# Patient Record
Sex: Female | Born: 1980 | Race: Asian | Hispanic: No | Marital: Married | Smoking: Never smoker
Health system: Southern US, Community
[De-identification: ages and names within clinical notes are randomized; demographics above are authoritative.]

## PROBLEM LIST (undated history)

## (undated) DIAGNOSIS — O24419 Gestational diabetes mellitus in pregnancy, unspecified control: Secondary | ICD-10-CM

---

## 2012-12-27 LAB — OB RESULTS CONSOLE RUBELLA ANTIBODY, IGM: Rubella: IMMUNE

## 2013-12-27 LAB — OB RESULTS CONSOLE ABO/RH: RH TYPE: POSITIVE

## 2013-12-27 LAB — OB RESULTS CONSOLE GC/CHLAMYDIA
CHLAMYDIA, DNA PROBE: NEGATIVE
Gonorrhea: NEGATIVE

## 2013-12-27 LAB — OB RESULTS CONSOLE VARICELLA ZOSTER ANTIBODY, IGG: Varicella: IMMUNE

## 2013-12-27 LAB — OB RESULTS CONSOLE RPR: RPR: NONREACTIVE

## 2013-12-27 LAB — OB RESULTS CONSOLE HGB/HCT, BLOOD
HCT: 39 %
HEMOGLOBIN: 12.6 g/dL

## 2013-12-27 LAB — OB RESULTS CONSOLE ANTIBODY SCREEN: Antibody Screen: NEGATIVE

## 2013-12-27 LAB — OB RESULTS CONSOLE HEPATITIS B SURFACE ANTIGEN: HEP B S AG: NEGATIVE

## 2013-12-27 LAB — GLUCOSE TOLERANCE, 1 HOUR (50G) W/O FASTING: Glucose, 1 hour: 154

## 2013-12-30 LAB — GLUCOSE TOLERANCE, 3 HOURS: Glucose: 90

## 2014-01-04 DIAGNOSIS — O24419 Gestational diabetes mellitus in pregnancy, unspecified control: Secondary | ICD-10-CM

## 2014-01-04 DIAGNOSIS — Z789 Other specified health status: Secondary | ICD-10-CM | POA: Insufficient documentation

## 2014-01-05 ENCOUNTER — Encounter: Payer: Self-pay | Admitting: *Deleted

## 2014-01-10 ENCOUNTER — Ambulatory Visit (INDEPENDENT_AMBULATORY_CARE_PROVIDER_SITE_OTHER): Payer: Self-pay | Admitting: Obstetrics & Gynecology

## 2014-01-10 ENCOUNTER — Encounter: Payer: Self-pay | Admitting: *Deleted

## 2014-01-10 ENCOUNTER — Encounter: Payer: Self-pay | Admitting: Obstetrics & Gynecology

## 2014-01-10 ENCOUNTER — Other Ambulatory Visit: Payer: Self-pay | Admitting: Obstetrics & Gynecology

## 2014-01-10 VITALS — BP 107/55 | HR 86 | Temp 98.3°F | Ht <= 58 in | Wt 87.5 lb

## 2014-01-10 DIAGNOSIS — Z3682 Encounter for antenatal screening for nuchal translucency: Secondary | ICD-10-CM

## 2014-01-10 DIAGNOSIS — O24410 Gestational diabetes mellitus in pregnancy, diet controlled: Secondary | ICD-10-CM

## 2014-01-10 LAB — POCT URINALYSIS DIP (DEVICE)
Bilirubin Urine: NEGATIVE
Glucose, UA: NEGATIVE mg/dL
HGB URINE DIPSTICK: NEGATIVE
Ketones, ur: NEGATIVE mg/dL
Nitrite: NEGATIVE
Protein, ur: NEGATIVE mg/dL
SPECIFIC GRAVITY, URINE: 1.015 (ref 1.005–1.030)
Urobilinogen, UA: 0.2 mg/dL (ref 0.0–1.0)
pH: 7 (ref 5.0–8.0)

## 2014-01-10 NOTE — Progress Notes (Signed)
Here for first visit. [facility] [place] [facility] for GDM. Given new patient information. C/o cramps sometimes that last all day or all night. Used Copywriter, advertisingnterpreter Win Khine for visit.

## 2014-01-10 NOTE — Patient Instructions (Signed)
Gestational Diabetes Mellitus Gestational diabetes mellitus, often simply referred to as gestational diabetes, is a type of diabetes that some women develop during pregnancy. In gestational diabetes, the pancreas does not make enough insulin (a hormone), the cells are less responsive to the insulin that is made (insulin resistance), or both.Normally, insulin moves sugars from food into the tissue cells. The tissue cells use the sugars for energy. The lack of insulin or the lack of normal response to insulin causes excess sugars to build up in the blood instead of going into the tissue cells. As a result, high blood sugar (hyperglycemia) develops. The effect of high sugar (glucose) levels can cause many problems.  RISK FACTORS You have an increased chance of developing gestational diabetes if you have a family history of diabetes and also have one or more of the following risk factors:  A body mass index over 30 (obesity).  A previous pregnancy with gestational diabetes.  An older age at the time of pregnancy. If blood glucose levels are kept in the normal range during pregnancy, women can have a healthy pregnancy. If your blood glucose levels are not well controlled, there may be risks to you, your unborn baby (fetus), your labor and delivery, or your newborn baby.  SYMPTOMS  If symptoms are experienced, they are much like symptoms you would normally expect during pregnancy. The symptoms of gestational diabetes include:   Increased thirst (polydipsia).  Increased urination (polyuria).  Increased urination during the night (nocturia).  Weight loss. This weight loss may be rapid.  Frequent, recurring infections.  Tiredness (fatigue).  Weakness.  Vision changes, such as blurred vision.  Fruity smell to your breath.  Abdominal pain. DIAGNOSIS Diabetes is diagnosed when blood glucose levels are increased. Your blood glucose level may be checked by one or more of the following blood  tests:  A fasting blood glucose test. You will not be allowed to eat for at least 8 hours before a blood sample is taken.  A random blood glucose test. Your blood glucose is checked at any time of the day regardless of when you ate.  A hemoglobin A1c blood glucose test. A hemoglobin A1c test provides information about blood glucose control over the previous 3 months.  An oral glucose tolerance test (OGTT). Your blood glucose is measured after you have not eaten (fasted) for 1-3 hours and then after you drink a glucose-containing beverage. Since the hormones that cause insulin resistance are highest at about 24-28 weeks of a pregnancy, an OGTT is usually performed during that time. If you have risk factors for gestational diabetes, your health care provider may test you for gestational diabetes earlier than 24 weeks of pregnancy. TREATMENT   You will need to take diabetes medicine or insulin daily to keep blood glucose levels in the desired range.  You will need to match insulin dosing with exercise and healthy food choices. The treatment goal is to maintain the before-meal (preprandial), bedtime, and overnight blood glucose level at 60-99 mg/dL during pregnancy. The treatment goal is to further maintain peak after-meal blood sugar (postprandial glucose) level at 100-140 mg/dL. HOME CARE INSTRUCTIONS   Have your hemoglobin A1c level checked twice a year.  Perform daily blood glucose monitoring as directed by your health care provider. It is common to perform frequent blood glucose monitoring.  Monitor urine ketones when you are ill and as directed by your health care provider.  Take your diabetes medicine and insulin as directed by your health care provider   to maintain your blood glucose level in the desired range.  Never run out of diabetes medicine or insulin. It is needed every day.  Adjust insulin based on your intake of carbohydrates. Carbohydrates can raise blood glucose levels but  need to be included in your diet. Carbohydrates provide vitamins, minerals, and fiber which are an essential part of a healthy diet. Carbohydrates are found in fruits, vegetables, whole grains, dairy products, legumes, and foods containing added sugars.  Eat healthy foods. Alternate 3 meals with 3 snacks.  Maintain a healthy weight gain. The usual total expected weight gain varies according to your prepregnancy body mass index (BMI).  Carry a medical alert card or wear your medical alert jewelry.  Carry a 15-gram carbohydrate snack with you at all times to treat low blood glucose (hypoglycemia). Some examples of 15-gram carbohydrate snacks include:  Glucose tablets, 3 or 4.  Glucose gel, 15-gram tube.  Raisins, 2 tablespoons (24 g).  Jelly beans, 6.  Animal crackers, 8.  Fruit juice, regular soda, or low-fat milk, 4 ounces (120 mL).  Gummy treats, 9.  Recognize hypoglycemia. Hypoglycemia during pregnancy occurs with blood glucose levels of 60 mg/dL and below. The risk for hypoglycemia increases when fasting or skipping meals, during or after intense exercise, and during sleep. Hypoglycemia symptoms can include:  Tremors or shakes.  Decreased ability to concentrate.  Sweating.  Increased heart rate.  Headache.  Dry mouth.  Hunger.  Irritability.  Anxiety.  Restless sleep.  Altered speech or coordination.  Confusion.  Treat hypoglycemia promptly. If you are alert and able to safely swallow, follow the 15:15 rule:  Take 15-20 grams of rapid-acting glucose or carbohydrate. Rapid-acting options include glucose gel, glucose tablets, or 4 ounces (120 mL) of fruit juice, regular soda, or low-fat milk.  Check your blood glucose level 15 minutes after taking the glucose.  Take 15-20 grams more of glucose if the repeat blood glucose level is still 70 mg/dL or below.  Eat a meal or snack within 1 hour once blood glucose levels return to normal.  Be alert to polyuria  (excess urination) and polydipsia (excess thirst) which are early signs of hyperglycemia. An early awareness of hyperglycemia allows for prompt treatment. Treat hyperglycemia as directed by your health care provider.  Engage in at least 30 minutes of physical activity a day or as directed by your health care provider. Ten minutes of physical activity timed 30 minutes after each meal is encouraged to control postprandial blood glucose levels.  Adjust your insulin dosing and food intake as needed if you start a new exercise or sport.  Follow your sick-day plan at any time you are unable to eat or drink as usual.  Avoid tobacco and alcohol use.  Keep all follow-up visits as directed by your health care provider.  Follow the advice of your health care provider regarding your prenatal and post-delivery (postpartum) appointments, meal planning, exercise, medicines, vitamins, blood tests, other medical tests, and physical activities.  Perform daily skin and foot care. Examine your skin and feet daily for cuts, bruises, redness, nail problems, bleeding, blisters, or sores.  Brush your teeth and gums at least twice a day and floss at least once a day. Follow up with your dentist regularly.  Schedule an eye exam during the first trimester of your pregnancy or as directed by your health care provider.  Share your diabetes management plan with your workplace or school.  Stay up-to-date with immunizations.  Learn to manage stress.    Obtain ongoing diabetes education and support as needed.  Learn about and consider breastfeeding your baby.  You should have your blood sugar level checked 6-12 weeks after delivery. This is done with an oral glucose tolerance test (OGTT). SEEK MEDICAL CARE IF:   You are unable to eat food or drink fluids for more than 6 hours.  You have nausea and vomiting for more than 6 hours.  You have a blood glucose level of 200 mg/dL and you have ketones in your  urine.  There is a change in mental status.  You develop vision problems.  You have a persistent headache.  You have upper abdominal pain or discomfort.  You develop an additional serious illness.  You have diarrhea for more than 6 hours.  You have been sick or have had a fever for a couple of days and are not getting better. SEEK IMMEDIATE MEDICAL CARE IF:   You have difficulty breathing.  You no longer feel the baby moving.  You are bleeding or have discharge from your vagina.  You start having premature contractions or labor. MAKE SURE YOU:  Understand these instructions.  Will watch your condition.  Will get help right away if you are not doing well or get worse. Document Released: 06/10/2000 Document Revised: 07/19/2013 Document Reviewed: 10/01/2011 ExitCare Patient Information 2015 [employer]. This information is not intended to replace advice given to you by your health care provider. Make sure you discuss any questions you have with your health care provider.  

## 2014-01-10 NOTE — Progress Notes (Signed)
[facility] with 1 hr GTT 154 and 3 hr  Was 90/109/170/150 per hand written report, will obtain lab result   transfer for failed 3 hr     Katherine Jones is a G3P2002 5713w3d being seen today for her first obstetrical visit.  Her obstetrical history is significant for abnormal glucose testing. Patient does intend to breast feed. Pregnancy history fully reviewed. Speaks [name] ([place]) Patient reports no complaints.  Filed Vitals:   01/10/14 0852 01/10/14 0853  BP: 107/55   Pulse: 86   Temp: 98.3 F (36.8 C)   Height:  4' 7.75" (1.416 m)  Weight: 87 lb 8 oz (39.69 kg)     HISTORY: OB History  Gravida Para Term Preterm AB SAB TAB Ectopic Multiple Living  3 2 2       2     # Outcome Date GA Lbr Len/2nd Weight Sex Delivery Anes PTL Lv  3 CUR           2 TRM 03/19/09 2730w0d  6 lb (2.722 kg) F SVD None  Y     Comments: [place]  1 TRM 12/14/06 1930w0d  5 lb (2.268 kg) F SVD None  Y     [place]     History reviewed. No pertinent past medical history. History reviewed. No pertinent past surgical history. History reviewed. No pertinent family history.   Exam    Uterus:     Pelvic Exam:                               System:     Skin: normal coloration and turgor, no rashes    Neurologic: oriented, normal mood   Extremities: normal strength, tone, and muscle mass   HEENT    Mouth/Teeth    Neck supple   Cardiovascular:    Respiratory:  appears well, vitals normal, no respiratory distress, acyanotic, normal RR   Abdomen: soft, non-tender; bowel sounds normal; no masses,  no organomegaly   Urinary:        Assessment:    Pregnancy: [id number] Patient Active Problem List   Diagnosis Date Noted  . Language barrier 01/04/2014  . Gestational diabetes mellitus in pregnancy 01/04/2014        Plan:     Initial labs drawn. Prenatal vitamins. Problem list reviewed and updated. Genetic Screening discussed First Screen: ordered.  Ultrasound discussed; fetal survey: 18-20 weeks.  Follow up in 1 weeks. 50% of 30 min visit spent on counseling and coordination of care.  Need DM educator   Delorice Bannister 01/10/2014

## 2014-01-10 NOTE — Progress Notes (Signed)
First Trimester Screen with MFM 01/14/14 @ 115p.  [place] interpreter Win Khine present. [facility].

## 2014-01-10 NOTE — Progress Notes (Signed)
Nutrition Note:  1st visit Pt. Seen for GDM diet education. Wt gain > expected. Pt. Given verbal & written GDM education.  Interpreter Win Khine. Currently eats 3 meals and 1 snack/day.  Diet high in carbohydrates (rice & fruit) and low in vegetables and protein. No food allergies and no N/V reported.  PNV daily. Client agrees to follow GDM diet including 3 meals & 3 snacks and proper CHO/protein combination. Discussed wt gain goals of 28-40# and incorporating physical activity. [facility] [place] [facility] 2-4 weeks. Candice C. Earlene Plateravis, MPH, RD, LDN

## 2014-01-13 ENCOUNTER — Encounter: Payer: Self-pay | Admitting: *Deleted

## 2014-01-14 ENCOUNTER — Ambulatory Visit (HOSPITAL_COMMUNITY)
Admission: RE | Admit: 2014-01-14 | Discharge: 2014-01-14 | Disposition: A | Discharge status: Home / Self Care | Payer: Medicaid Other | Source: Ambulatory Visit | Attending: Obstetrics & Gynecology | Admitting: Obstetrics & Gynecology

## 2014-01-14 ENCOUNTER — Encounter (HOSPITAL_COMMUNITY): Payer: Self-pay

## 2014-01-14 VITALS — BP 133/69 | HR 72 | Wt 86.0 lb

## 2014-01-14 DIAGNOSIS — Z3A12 12 weeks gestation of pregnancy: Secondary | ICD-10-CM

## 2014-01-14 DIAGNOSIS — Z36 Encounter for antenatal screening of mother: Secondary | ICD-10-CM | POA: Insufficient documentation

## 2014-01-14 DIAGNOSIS — Z3682 Encounter for antenatal screening for nuchal translucency: Secondary | ICD-10-CM

## 2014-01-17 ENCOUNTER — Encounter: Payer: Medicaid Other | Attending: Obstetrics & Gynecology | Admitting: *Deleted

## 2014-01-17 ENCOUNTER — Ambulatory Visit: Payer: Medicaid Other | Admitting: Obstetrics & Gynecology

## 2014-01-17 VITALS — Wt 87.5 lb

## 2014-01-17 DIAGNOSIS — Z713 Dietary counseling and surveillance: Secondary | ICD-10-CM | POA: Insufficient documentation

## 2014-01-17 DIAGNOSIS — Z789 Other specified health status: Secondary | ICD-10-CM

## 2014-01-17 DIAGNOSIS — O24419 Gestational diabetes mellitus in pregnancy, unspecified control: Secondary | ICD-10-CM | POA: Insufficient documentation

## 2014-01-17 LAB — POCT URINALYSIS DIP (DEVICE)
Bilirubin Urine: NEGATIVE
Glucose, UA: NEGATIVE mg/dL
Hgb urine dipstick: NEGATIVE
Ketones, ur: NEGATIVE mg/dL
Nitrite: NEGATIVE
PROTEIN: NEGATIVE mg/dL
Specific Gravity, Urine: 1.02 (ref 1.005–1.030)
UROBILINOGEN UA: 0.2 mg/dL (ref 0.0–1.0)
pH: 7 (ref 5.0–8.0)

## 2014-01-17 NOTE — Progress Notes (Signed)
  Patient was seen on 01/17/14 for Gestational Diabetes self-management class at [facility]. The following learning objectives were met by the patient during this course:   States the definition of Gestational Diabetes  States when to check blood glucose levels  Demonstrates proper blood glucose monitoring techniques  Blood glucose monitor given: True Track Blood glucose reading: within target range  Patient instructed to monitor glucose levels: FBS: 60 - <90 2 hour: <120  *Patient received handouts:  Nutrition Diabetes and Pregnancy  Carbohydrate Counting List  Patient will be seen for follow-up as needed.

## 2014-01-17 NOTE — Progress Notes (Signed)
Nutrition note: f/u re: GDM diet questions Pt saw RD last week but had some questions about what she can eat. Pt's wt still > expected. Pt reports eating 3 meals & 3 snacks/d but was confused about CHO foods. Reviewed GDM diet with picture handout. Pt received testing supplies today from Diabetes Educator and will start checking her BS. Pt agrees to follow GDM diet with proper CHO/ protein combination. Pt reports no further questions today. F/u in 4-6 wks Blondell RevealLaura Kaden Daughdrill, MS, RD, LDN, [facility]

## 2014-01-17 NOTE — Patient Instructions (Signed)
Return to clinic for any obstetric concerns or go to [facility] for evaluation  

## 2014-01-17 NOTE — Progress Notes (Signed)
Patient here to see DM educator only, will get testing supplies and return in one week for MD visit.

## 2014-01-18 ENCOUNTER — Encounter (HOSPITAL_COMMUNITY): Payer: Self-pay

## 2014-01-25 ENCOUNTER — Other Ambulatory Visit (HOSPITAL_COMMUNITY): Payer: Self-pay

## 2014-02-14 ENCOUNTER — Ambulatory Visit (INDEPENDENT_AMBULATORY_CARE_PROVIDER_SITE_OTHER): Payer: Medicaid Other | Admitting: Family Medicine

## 2014-02-14 ENCOUNTER — Encounter: Payer: Self-pay | Admitting: Family Medicine

## 2014-02-14 VITALS — BP 115/67 | HR 84 | Temp 97.7°F | Wt 92.7 lb

## 2014-02-14 DIAGNOSIS — O0992 Supervision of high risk pregnancy, unspecified, second trimester: Secondary | ICD-10-CM

## 2014-02-14 DIAGNOSIS — O24919 Unspecified diabetes mellitus in pregnancy, unspecified trimester: Secondary | ICD-10-CM | POA: Insufficient documentation

## 2014-02-14 DIAGNOSIS — O0990 Supervision of high risk pregnancy, unspecified, unspecified trimester: Secondary | ICD-10-CM | POA: Insufficient documentation

## 2014-02-14 DIAGNOSIS — O24912 Unspecified diabetes mellitus in pregnancy, second trimester: Secondary | ICD-10-CM

## 2014-02-14 DIAGNOSIS — O24419 Gestational diabetes mellitus in pregnancy, unspecified control: Secondary | ICD-10-CM

## 2014-02-14 DIAGNOSIS — Z23 Encounter for immunization: Secondary | ICD-10-CM

## 2014-02-14 LAB — POCT URINALYSIS DIP (DEVICE)
Bilirubin Urine: NEGATIVE
Glucose, UA: NEGATIVE mg/dL
Hgb urine dipstick: NEGATIVE
Ketones, ur: NEGATIVE mg/dL
LEUKOCYTES UA: NEGATIVE
NITRITE: NEGATIVE
Protein, ur: NEGATIVE mg/dL
Specific Gravity, Urine: 1.015 (ref 1.005–1.030)
UROBILINOGEN UA: 0.2 mg/dL (ref 0.0–1.0)
pH: 7 (ref 5.0–8.0)

## 2014-02-14 LAB — TSH: TSH: 1.222 u[IU]/mL (ref 0.350–4.500)

## 2014-02-14 MED ORDER — GLYBURIDE 2.5 MG PO TABS: 2.5000 mg | ORAL_TABLET | Freq: Every day | ORAL | Status: DC

## 2014-02-14 MED ORDER — ASPIRIN 81 MG PO CHEW: 81.0000 mg | CHEWABLE_TABLET | Freq: Every day | ORAL | Status: DC

## 2014-02-14 NOTE — Progress Notes (Signed)
Anatomy U/S with [facility] 02/21/14 @ 8a. [facility] Dr. Karleen HampshireSpencer 03/03/14 @ 1115a. Fetal Echo with Dr. Elizebeth Brookingotton 03/08/14 @ 11a.  [place] interpreter Win Khine present.

## 2014-02-14 NOTE — Patient Instructions (Signed)
Gestational Diabetes Mellitus Gestational diabetes mellitus, often simply referred to as gestational diabetes, is a type of diabetes that some women develop during pregnancy. In gestational diabetes, the pancreas does not make enough insulin (a hormone), the cells are less responsive to the insulin that is made (insulin resistance), or both.Normally, insulin moves sugars from food into the tissue cells. The tissue cells use the sugars for energy. The lack of insulin or the lack of normal response to insulin causes excess sugars to build up in the blood instead of going into the tissue cells. As a result, high blood sugar (hyperglycemia) develops. The effect of high sugar (glucose) levels can cause many problems.  RISK FACTORS You have an increased chance of developing gestational diabetes if you have a family history of diabetes and also have one or more of the following risk factors:  A body mass index over 30 (obesity).  A previous pregnancy with gestational diabetes.  An older age at the time of pregnancy. If blood glucose levels are kept in the normal range during pregnancy, women can have a healthy pregnancy. If your blood glucose levels are not well controlled, there may be risks to you, your unborn baby (fetus), your labor and delivery, or your newborn baby.  SYMPTOMS  If symptoms are experienced, they are much like symptoms you would normally expect during pregnancy. The symptoms of gestational diabetes include:   Increased thirst (polydipsia).  Increased urination (polyuria).  Increased urination during the night (nocturia).  Weight loss. This weight loss may be rapid.  Frequent, recurring infections.  Tiredness (fatigue).  Weakness.  Vision changes, such as blurred vision.  Fruity smell to your breath.  Abdominal pain. DIAGNOSIS Diabetes is diagnosed when blood glucose levels are increased. Your blood glucose level may be checked by one or more of the following blood  tests:  A fasting blood glucose test. You will not be allowed to eat for at least 8 hours before a blood sample is taken.  A random blood glucose test. Your blood glucose is checked at any time of the day regardless of when you ate.  A hemoglobin A1c blood glucose test. A hemoglobin A1c test provides information about blood glucose control over the previous 3 months.  An oral glucose tolerance test (OGTT). Your blood glucose is measured after you have not eaten (fasted) for 1-3 hours and then after you drink a glucose-containing beverage. Since the hormones that cause insulin resistance are highest at about 24-28 weeks of a pregnancy, an OGTT is usually performed during that time. If you have risk factors for gestational diabetes, your health care provider may test you for gestational diabetes earlier than 24 weeks of pregnancy. TREATMENT   You will need to take diabetes medicine or insulin daily to keep blood glucose levels in the desired range.  You will need to match insulin dosing with exercise and healthy food choices. The treatment goal is to maintain the before-meal (preprandial), bedtime, and overnight blood glucose level at 60-99 mg/dL during pregnancy. The treatment goal is to further maintain peak after-meal blood sugar (postprandial glucose) level at 100-140 mg/dL. HOME CARE INSTRUCTIONS   Have your hemoglobin A1c level checked twice a year.  Perform daily blood glucose monitoring as directed by your health care provider. It is common to perform frequent blood glucose monitoring.  Monitor urine ketones when you are ill and as directed by your health care provider.  Take your diabetes medicine and insulin as directed by your health care provider   to maintain your blood glucose level in the desired range.  Never run out of diabetes medicine or insulin. It is needed every day.  Adjust insulin based on your intake of carbohydrates. Carbohydrates can raise blood glucose levels but  need to be included in your diet. Carbohydrates provide vitamins, minerals, and fiber which are an essential part of a healthy diet. Carbohydrates are found in fruits, vegetables, whole grains, dairy products, legumes, and foods containing added sugars.  Eat healthy foods. Alternate 3 meals with 3 snacks.  Maintain a healthy weight gain. The usual total expected weight gain varies according to your prepregnancy body mass index (BMI).  Carry a medical alert card or wear your medical alert jewelry.  Carry a 15-gram carbohydrate snack with you at all times to treat low blood glucose (hypoglycemia). Some examples of 15-gram carbohydrate snacks include:  Glucose tablets, 3 or 4.  Glucose gel, 15-gram tube.  Raisins, 2 tablespoons (24 g).  Jelly beans, 6.  Animal crackers, 8.  Fruit juice, regular soda, or low-fat milk, 4 ounces (120 mL).  Gummy treats, 9.  Recognize hypoglycemia. Hypoglycemia during pregnancy occurs with blood glucose levels of 60 mg/dL and below. The risk for hypoglycemia increases when fasting or skipping meals, during or after intense exercise, and during sleep. Hypoglycemia symptoms can include:  Tremors or shakes.  Decreased ability to concentrate.  Sweating.  Increased heart rate.  Headache.  Dry mouth.  Hunger.  Irritability.  Anxiety.  Restless sleep.  Altered speech or coordination.  Confusion.  Treat hypoglycemia promptly. If you are alert and able to safely swallow, follow the 15:15 rule:  Take 15-20 grams of rapid-acting glucose or carbohydrate. Rapid-acting options include glucose gel, glucose tablets, or 4 ounces (120 mL) of fruit juice, regular soda, or low-fat milk.  Check your blood glucose level 15 minutes after taking the glucose.  Take 15-20 grams more of glucose if the repeat blood glucose level is still 70 mg/dL or below.  Eat a meal or snack within 1 hour once blood glucose levels return to normal.  Be alert to polyuria  (excess urination) and polydipsia (excess thirst) which are early signs of hyperglycemia. An early awareness of hyperglycemia allows for prompt treatment. Treat hyperglycemia as directed by your health care provider.  Engage in at least 30 minutes of physical activity a day or as directed by your health care provider. Ten minutes of physical activity timed 30 minutes after each meal is encouraged to control postprandial blood glucose levels.  Adjust your insulin dosing and food intake as needed if you start a new exercise or sport.  Follow your sick-day plan at any time you are unable to eat or drink as usual.  Avoid tobacco and alcohol use.  Keep all follow-up visits as directed by your health care provider.  Follow the advice of your health care provider regarding your prenatal and post-delivery (postpartum) appointments, meal planning, exercise, medicines, vitamins, blood tests, other medical tests, and physical activities.  Perform daily skin and foot care. Examine your skin and feet daily for cuts, bruises, redness, nail problems, bleeding, blisters, or sores.  Brush your teeth and gums at least twice a day and floss at least once a day. Follow up with your dentist regularly.  Schedule an eye exam during the first trimester of your pregnancy or as directed by your health care provider.  Share your diabetes management plan with your workplace or school.  Stay up-to-date with immunizations.  Learn to manage stress.    Obtain ongoing diabetes education and support as needed.  Learn about and consider breastfeeding your baby.  You should have your blood sugar level checked 6-12 weeks after delivery. This is done with an oral glucose tolerance test (OGTT). SEEK MEDICAL CARE IF:   You are unable to eat food or drink fluids for more than 6 hours.  You have nausea and vomiting for more than 6 hours.  You have a blood glucose level of 200 mg/dL and you have ketones in your  urine.  There is a change in mental status.  You develop vision problems.  You have a persistent headache.  You have upper abdominal pain or discomfort.  You develop an additional serious illness.  You have diarrhea for more than 6 hours.  You have been sick or have had a fever for a couple of days and are not getting better. SEEK IMMEDIATE MEDICAL CARE IF:   You have difficulty breathing.  You no longer feel the baby moving.  You are bleeding or have discharge from your vagina.  You start having premature contractions or labor. MAKE SURE YOU:  Understand these instructions.  Will watch your condition.  Will get help right away if you are not doing well or get worse. Document Released: 06/10/2000 Document Revised: 07/19/2013 Document Reviewed: 10/01/2011 Exit[employer] Patient Information 2015 [employer]. This information is not intended to replace advice given to you by your health care provider. Make sure you discuss any questions you have with your health care provider.  Breastfeeding Deciding to breastfeed is one of the best choices you can make for you and your baby. A change in hormones during pregnancy causes your breast tissue to grow and increases the number and size of your milk ducts. These hormones also allow proteins, sugars, and fats from your blood supply to make breast milk in your milk-producing glands. Hormones prevent breast milk from being released before your baby is born as well as prompt milk flow after birth. Once breastfeeding has begun, thoughts of your baby, as well as his or her sucking or crying, can stimulate the release of milk from your milk-producing glands.  BENEFITS OF BREASTFEEDING For Your Baby  Your first milk (colostrum) helps your baby's digestive system function better.   There are antibodies in your milk that help your baby fight off infections.   Your baby has a lower incidence of asthma, allergies, and sudden infant death  syndrome.   The nutrients in breast milk are better for your baby than infant formulas and are designed uniquely for your baby's needs.   Breast milk improves your baby's brain development.   Your baby is less likely to develop other conditions, such as childhood obesity, asthma, or type 2 diabetes mellitus.  For You   Breastfeeding helps to create a very special bond between you and your baby.   Breastfeeding is convenient. Breast milk is always available at the correct temperature and costs nothing.   Breastfeeding helps to burn calories and helps you lose the weight gained during pregnancy.   Breastfeeding makes your uterus contract to its prepregnancy size faster and slows bleeding (lochia) after you give birth.   Breastfeeding helps to lower your risk of developing type 2 diabetes mellitus, osteoporosis, and breast or ovarian cancer later in life. SIGNS THAT YOUR BABY IS HUNGRY Early Signs of Hunger  Increased alertness or activity.  Stretching.  Movement of the head from side to side.  Movement of the head and opening of the   mouth when the corner of the mouth or cheek is stroked (rooting).  Increased sucking sounds, smacking lips, cooing, sighing, or squeaking.  Hand-to-mouth movements.  Increased sucking of fingers or hands. Late Signs of Hunger  Fussing.  Intermittent crying. Extreme Signs of Hunger Signs of extreme hunger will require calming and consoling before your baby will be able to breastfeed successfully. Do not wait for the following signs of extreme hunger to occur before you initiate breastfeeding:   Restlessness.  A loud, strong cry.   Screaming. BREASTFEEDING BASICS Breastfeeding Initiation  Find a comfortable place to sit or lie down, with your neck and back well supported.  Place a pillow or rolled up blanket under your baby to bring him or her to the level of your breast (if you are seated). Nursing pillows are specially designed  to help support your arms and your baby while you breastfeed.  Make sure that your baby's abdomen is facing your abdomen.   Gently massage your breast. With your fingertips, massage from your chest wall toward your nipple in a circular motion. This encourages milk flow. You may need to continue this action during the feeding if your milk flows slowly.  Support your breast with 4 fingers underneath and your thumb above your nipple. Make sure your fingers are well away from your nipple and your baby's mouth.   Stroke your baby's lips gently with your finger or nipple.   When your baby's mouth is open wide enough, quickly bring your baby to your breast, placing your entire nipple and as much of the colored area around your nipple (areola) as possible into your baby's mouth.   More areola should be visible above your baby's upper lip than below the lower lip.   Your baby's tongue should be between his or her lower gum and your breast.   Ensure that your baby's mouth is correctly positioned around your nipple (latched). Your baby's lips should create a seal on your breast and be turned out (everted).  It is common for your baby to suck about 2-3 minutes in order to start the flow of breast milk. Latching Teaching your baby how to latch on to your breast properly is very important. An improper latch can cause nipple pain and decreased milk supply for you and poor weight gain in your baby. Also, if your baby is not latched onto your nipple properly, he or she may swallow some air during feeding. This can make your baby fussy. Burping your baby when you switch breasts during the feeding can help to get rid of the air. However, teaching your baby to latch on properly is still the best way to prevent fussiness from swallowing air while breastfeeding. Signs that your baby has successfully latched on to your nipple:    Silent tugging or silent sucking, without causing you pain.   Swallowing  heard between every 3-4 sucks.    Muscle movement above and in front of his or her ears while sucking.  Signs that your baby has not successfully latched on to nipple:   Sucking sounds or smacking sounds from your baby while breastfeeding.  Nipple pain. If you think your baby has not latched on correctly, slip your finger into the corner of your baby's mouth to break the suction and place it between your baby's gums. Attempt breastfeeding initiation again. Signs of Successful Breastfeeding Signs from your baby:   A gradual decrease in the number of sucks or complete cessation of sucking.     Falling asleep.   Relaxation of his or her body.   Retention of a small amount of milk in his or her mouth.   Letting go of your breast by himself or herself. Signs from you:  Breasts that have increased in firmness, weight, and size 1-3 hours after feeding.   Breasts that are softer immediately after breastfeeding.  Increased milk volume, as well as a change in milk consistency and color by the fifth day of breastfeeding.   Nipples that are not sore, cracked, or bleeding. Signs That Your Baby is Getting Enough Milk  Wetting at least 3 diapers in a 24-hour period. The urine should be clear and pale yellow by age 5 days.  At least 3 stools in a 24-hour period by age 5 days. The stool should be soft and yellow.  At least 3 stools in a 24-hour period by age 7 days. The stool should be seedy and yellow.  No loss of weight greater than 10% of birth weight during the first 3 days of age.  Average weight gain of 4-7 ounces (113-198 g) per week after age 4 days.  Consistent daily weight gain by age 5 days, without weight loss after the age of 2 weeks. After a feeding, your baby may spit up a small amount. This is common. BREASTFEEDING FREQUENCY AND DURATION Frequent feeding will help you make more milk and can prevent sore nipples and breast engorgement. Breastfeed when you feel the  need to reduce the fullness of your breasts or when your baby shows signs of hunger. This is called "breastfeeding on demand." Avoid introducing a pacifier to your baby while you are working to establish breastfeeding (the first 4-6 weeks after your baby is born). After this time you may choose to use a pacifier. Research has shown that pacifier use during the first year of a baby's life decreases the risk of sudden infant death syndrome (SIDS). Allow your baby to feed on each breast as long as he or she wants. Breastfeed until your baby is finished feeding. When your baby unlatches or falls asleep while feeding from the first breast, offer the second breast. Because newborns are often sleepy in the first few weeks of life, you may need to awaken your baby to get him or her to feed. Breastfeeding times will vary from baby to baby. However, the following rules can serve as a guide to help you ensure that your baby is properly fed:  Newborns (babies 4 weeks of age or younger) may breastfeed every 1-3 hours.  Newborns should not go longer than 3 hours during the day or 5 hours during the night without breastfeeding.  You should breastfeed your baby a minimum of 8 times in a 24-hour period until you begin to introduce solid foods to your baby at around 6 months of age. BREAST MILK PUMPING Pumping and storing breast milk allows you to ensure that your baby is exclusively fed your breast milk, even at times when you are unable to breastfeed. This is especially important if you are going back to work while you are still breastfeeding or when you are not able to be present during feedings. Your lactation consultant can give you guidelines on how long it is safe to store breast milk.  A breast pump is a machine that allows you to pump milk from your breast into a sterile bottle. The pumped breast milk can then be stored in a refrigerator or freezer. Some breast pumps are operated by   hand, while others use  electricity. Ask your lactation consultant which type will work best for you. Breast pumps can be purchased, but some hospitals and breastfeeding support groups lease breast pumps on a monthly basis. A lactation consultant can teach you how to hand express breast milk, if you prefer not to use a pump.  CARING FOR YOUR BREASTS WHILE YOU BREASTFEED Nipples can become dry, cracked, and sore while breastfeeding. The following recommendations can help keep your breasts moisturized and healthy:  Avoid using soap on your nipples.   Wear a supportive bra. Although not required, special nursing bras and tank tops are designed to allow access to your breasts for breastfeeding without taking off your entire bra or top. Avoid wearing underwire-style bras or extremely tight bras.  Air dry your nipples for 3-4minutes after each feeding.   Use only cotton bra pads to absorb leaked breast milk. Leaking of breast milk between feedings is normal.   Use lanolin on your nipples after breastfeeding. Lanolin helps to maintain your skin's normal moisture barrier. If you use pure lanolin, you do not need to wash it off before feeding your baby again. Pure lanolin is not toxic to your baby. You may also hand express a few drops of breast milk and gently massage that milk into your nipples and allow the milk to air dry. In the first few weeks after giving birth, some women experience extremely full breasts (engorgement). Engorgement can make your breasts feel heavy, warm, and tender to the touch. Engorgement peaks within 3-5 days after you give birth. The following recommendations can help ease engorgement:  Completely empty your breasts while breastfeeding or pumping. You may want to start by applying warm, moist heat (in the shower or with warm water-soaked hand towels) just before feeding or pumping. This increases circulation and helps the milk flow. If your baby does not completely empty your breasts while  breastfeeding, pump any extra milk after he or she is finished.  Wear a snug bra (nursing or regular) or tank top for 1-2 days to signal your body to slightly decrease milk production.  Apply ice packs to your breasts, unless this is too uncomfortable for you.  Make sure that your baby is latched on and positioned properly while breastfeeding. If engorgement persists after 48 hours of following these recommendations, contact your health care provider or a lactation consultant. OVERALL HEALTH CARE RECOMMENDATIONS WHILE BREASTFEEDING  Eat healthy foods. Alternate between meals and snacks, eating 3 of each per day. Because what you eat affects your breast milk, some of the foods may make your baby more irritable than usual. Avoid eating these foods if you are sure that they are negatively affecting your baby.  Drink milk, fruit juice, and water to satisfy your thirst (about 10 glasses a day).   Rest often, relax, and continue to take your prenatal vitamins to prevent fatigue, stress, and anemia.  Continue breast self-awareness checks.  Avoid chewing and smoking tobacco.  Avoid alcohol and drug use. Some medicines that may be harmful to your baby can pass through breast milk. It is important to ask your health care provider before taking any medicine, including all over-the-counter and prescription medicine as well as vitamin and herbal supplements. It is possible to become pregnant while breastfeeding. If birth control is desired, ask your health care provider about options that will be safe for your baby. SEEK MEDICAL CARE IF:   You feel like you want to stop breastfeeding or have become   frustrated with breastfeeding.  You have painful breasts or nipples.  Your nipples are cracked or bleeding.  Your breasts are red, tender, or warm.  You have a swollen area on either breast.  You have a fever or chills.  You have nausea or vomiting.  You have drainage other than breast milk from  your nipples.  Your breasts do not become full before feedings by the fifth day after you give birth.  You feel sad and depressed.  Your baby is too sleepy to eat well.  Your baby is having trouble sleeping.   Your baby is wetting less than 3 diapers in a 24-hour period.  Your baby has less than 3 stools in a 24-hour period.  Your baby's skin or the white part of his or her eyes becomes yellow.   Your baby is not gaining weight by 5 days of age. SEEK IMMEDIATE MEDICAL CARE IF:   Your baby is overly tired (lethargic) and does not want to wake up and feed.  Your baby develops an unexplained fever. Document Released: 03/04/2005 Document Revised: 03/09/2013 Document Reviewed: 08/26/2012 Exit[employer] Patient Information 2015 [employer]. This information is not intended to replace advice given to you by your health care provider. Make sure you discuss any questions you have with your health care provider.  

## 2014-02-14 NOTE — Progress Notes (Signed)
Patient states if she eats a lot her blood sugar is high so she hasn't been eating much; [place] present for interpreter; reports recent headache with blurry vision and dizziness

## 2014-02-14 NOTE — Progress Notes (Signed)
[place] interpreter Katherine Jones used. FBS 68-88 2 he pp 68-188 (16 of 42 BS are out)--add glyburide in am Needs baseline labs, optho, fetal echo Flu shot today AFP only today Schedule anatomy

## 2014-02-15 LAB — PROTEIN / CREATININE RATIO, URINE
Creatinine, Urine: 49.9 mg/dL
Protein Creatinine Ratio: 0.08 (ref <0.15)
Total Protein, Urine: 4 mg/dL — ABNORMAL LOW (ref 5–24)

## 2014-02-15 LAB — COMPREHENSIVE METABOLIC PANEL
ALT: 19 U/L (ref 0–35)
AST: 20 U/L (ref 0–37)
Albumin: 3.5 g/dL (ref 3.5–5.2)
Alkaline Phosphatase: 61 U/L (ref 39–117)
BUN: 6 mg/dL (ref 6–23)
CO2: 26 meq/L (ref 19–32)
Calcium: 9 mg/dL (ref 8.4–10.5)
Chloride: 105 mEq/L (ref 96–112)
Creat: 0.36 mg/dL — ABNORMAL LOW (ref 0.50–1.10)
Glucose, Bld: 80 mg/dL (ref 70–99)
Potassium: 4.2 mEq/L (ref 3.5–5.3)
Sodium: 135 mEq/L (ref 135–145)
TOTAL PROTEIN: 6 g/dL (ref 6.0–8.3)
Total Bilirubin: 0.2 mg/dL (ref 0.2–1.2)

## 2014-02-15 LAB — ALPHA FETOPROTEIN, MATERNAL
AFP: 89.3 ng/mL
CURR GEST AGE: 17.5 wks.days
MoM for AFP: 1.32
OPEN SPINA BIFIDA: NEGATIVE
Osb Risk: 1:4730 {titer}

## 2014-02-15 LAB — HEMOGLOBIN A1C
Hgb A1c MFr Bld: 5.3 % (ref <5.7)
Mean Plasma Glucose: 105 mg/dL (ref <117)

## 2014-02-22 ENCOUNTER — Other Ambulatory Visit: Payer: Self-pay | Admitting: Family Medicine

## 2014-02-22 ENCOUNTER — Encounter (HOSPITAL_COMMUNITY): Payer: Self-pay

## 2014-02-22 ENCOUNTER — Ambulatory Visit (INDEPENDENT_AMBULATORY_CARE_PROVIDER_SITE_OTHER): Payer: Medicaid Other | Admitting: Obstetrics & Gynecology

## 2014-02-22 ENCOUNTER — Ambulatory Visit (HOSPITAL_COMMUNITY)
Admission: RE | Admit: 2014-02-22 | Discharge: 2014-02-22 | Disposition: A | Discharge status: Home / Self Care | Payer: Medicaid Other | Source: Ambulatory Visit | Attending: Family Medicine | Admitting: Family Medicine

## 2014-02-22 VITALS — BP 109/63 | HR 89 | Temp 97.4°F | Wt 93.4 lb

## 2014-02-22 DIAGNOSIS — O24912 Unspecified diabetes mellitus in pregnancy, second trimester: Secondary | ICD-10-CM

## 2014-02-22 DIAGNOSIS — O24112 Pre-existing diabetes mellitus, type 2, in pregnancy, second trimester: Secondary | ICD-10-CM | POA: Insufficient documentation

## 2014-02-22 DIAGNOSIS — Z3A18 18 weeks gestation of pregnancy: Secondary | ICD-10-CM | POA: Insufficient documentation

## 2014-02-22 DIAGNOSIS — E119 Type 2 diabetes mellitus without complications: Secondary | ICD-10-CM | POA: Insufficient documentation

## 2014-02-22 DIAGNOSIS — Z3689 Encounter for other specified antenatal screening: Secondary | ICD-10-CM

## 2014-02-22 DIAGNOSIS — Z36 Encounter for antenatal screening of mother: Secondary | ICD-10-CM | POA: Insufficient documentation

## 2014-02-22 LAB — POCT URINALYSIS DIP (DEVICE)
BILIRUBIN URINE: NEGATIVE
Glucose, UA: NEGATIVE mg/dL
Hgb urine dipstick: NEGATIVE
KETONES UR: NEGATIVE mg/dL
Nitrite: NEGATIVE
PH: 7.5 (ref 5.0–8.0)
Protein, ur: NEGATIVE mg/dL
SPECIFIC GRAVITY, URINE: 1.015 (ref 1.005–1.030)
Urobilinogen, UA: 0.2 mg/dL (ref 0.0–1.0)

## 2014-02-22 NOTE — Progress Notes (Signed)
Pt states that she couldn't take the glyburide, she tried for 3 days but felt bad when she took it.

## 2014-02-22 NOTE — Progress Notes (Signed)
Will try to control with diet as she could not tolerate glyburide. FBS all in range, majority of PP < 120, some 144. still needs eye exam, US is scheduled

## 2014-02-28 ENCOUNTER — Other Ambulatory Visit (HOSPITAL_COMMUNITY): Payer: Medicaid Other

## 2014-02-28 LAB — US OB DETAIL + 14 WK

## 2014-03-15 ENCOUNTER — Ambulatory Visit (INDEPENDENT_AMBULATORY_CARE_PROVIDER_SITE_OTHER): Payer: Medicaid Other | Admitting: Family Medicine

## 2014-03-15 VITALS — BP 109/56 | HR 86 | Temp 98.0°F | Wt 97.9 lb

## 2014-03-15 DIAGNOSIS — O24419 Gestational diabetes mellitus in pregnancy, unspecified control: Secondary | ICD-10-CM

## 2014-03-15 DIAGNOSIS — O24912 Unspecified diabetes mellitus in pregnancy, second trimester: Secondary | ICD-10-CM

## 2014-03-15 DIAGNOSIS — O0992 Supervision of high risk pregnancy, unspecified, second trimester: Secondary | ICD-10-CM

## 2014-03-15 LAB — POCT URINALYSIS DIP (DEVICE)
BILIRUBIN URINE: NEGATIVE
Glucose, UA: NEGATIVE mg/dL
Hgb urine dipstick: NEGATIVE
Ketones, ur: NEGATIVE mg/dL
NITRITE: NEGATIVE
PH: 7 (ref 5.0–8.0)
Protein, ur: NEGATIVE mg/dL
Specific Gravity, Urine: 1.015 (ref 1.005–1.030)
Urobilinogen, UA: 0.2 mg/dL (ref 0.0–1.0)

## 2014-03-15 MED ORDER — GLYBURIDE 2.5 MG PO TABS: 2.5000 mg | ORAL_TABLET | Freq: Every day | ORAL | Status: DC

## 2014-03-15 MED ORDER — ASPIRIN 81 MG PO CHEW: 81.0000 mg | CHEWABLE_TABLET | Freq: Every day | ORAL | Status: DC

## 2014-03-15 NOTE — Progress Notes (Signed)
Had fetal echo on 12/22.  No results yet.  Ran out of lancets, has not checked blood sugar since 12/12.  Lancets given. Patient has been controlling with diet since last appt - holding glyburide.  Follow up 2 weeks.   Instructed pt to continue aspirin.

## 2014-03-15 NOTE — Progress Notes (Signed)
Called patient with pacific interpreter [phone]. Husband answered and stated she was not available. Left message that patient can get 81mg  aspirin OTC at any pharmacy or the dollar store. Verbalized understanding. No questions or concerns.

## 2014-03-15 NOTE — Progress Notes (Signed)
Patient not taking glyburide or aspirin because she states "I ran out." [facility] Patient verbalized understanding.  Katherine Jones used as interpreter for this encounter.

## 2014-03-15 NOTE — Progress Notes (Signed)
Called patient with pacific interpreter [phone]

## 2014-03-15 NOTE — Addendum Note (Signed)
Addended by: Louanna RawAMPBELL, Choice Kleinsasser M on: 03/15/2014 01:27 PM   Modules accepted: Orders, Medications

## 2014-03-15 NOTE — Progress Notes (Signed)
RX for aspirin printed. [facility] [phone]). They stated they do not carry that-- patient can buy OTC at the dollar store.

## 2014-03-18 NOTE — L&D Delivery Note (Cosign Needed)
Patient is 34 y.o. [id number] admitted SROM, hx of A2GDM  Delivery Note At 9:43 PM a viable female was delivered via Vaginal, Spontaneous Delivery (Presentation: Left Occiput Anterior).  APGAR: 9, 9; weight.  Infant dusky appearing at birth.  She was suctioned, dried and placed on mother's abdomen.  Cord clamped and cut by FOB.  Baby placed under warmer at about 14 minutes of life.  O2 saturations were not appropriate and she was transferred to the nursery for further management.  Rodena PietyFran Cresenzo, CMN, gloved and actively participated in the entirety of the delivery.  Hospital cord blood sample collected.  Placenta status: Intact, Spontaneous.  Cord: 3 vessel, with the following complications: loose nuchal x1.    Anesthesia: Local  Episiotomy: None Lacerations: 2nd degree perineal Suture Repair: 3.0 vicryl rapide Est. Blood Loss (mL): 108 cc  Mom to postpartum.  Baby to Nursery.  Delynn FlavinGottschalk, Ashly M, DO 07/07/2014, 10:21 PM  I have seen and examined this patient and agree the above assessment. CRESENZO-DISHMAN,Trinda Harlacher 07/07/2014 11:22 PM

## 2014-03-21 ENCOUNTER — Encounter: Payer: Self-pay | Admitting: *Deleted

## 2014-03-29 ENCOUNTER — Encounter: Payer: Self-pay | Admitting: Obstetrics and Gynecology

## 2014-03-29 ENCOUNTER — Ambulatory Visit (INDEPENDENT_AMBULATORY_CARE_PROVIDER_SITE_OTHER): Payer: Medicaid Other | Admitting: Obstetrics and Gynecology

## 2014-03-29 VITALS — BP 113/34 | HR 85 | Wt 101.7 lb

## 2014-03-29 DIAGNOSIS — O24912 Unspecified diabetes mellitus in pregnancy, second trimester: Secondary | ICD-10-CM

## 2014-03-29 DIAGNOSIS — Z789 Other specified health status: Secondary | ICD-10-CM

## 2014-03-29 DIAGNOSIS — O0992 Supervision of high risk pregnancy, unspecified, second trimester: Secondary | ICD-10-CM

## 2014-03-29 MED ORDER — GLUCOSE BLOOD VI STRP: ORAL_STRIP | Status: DC

## 2014-03-29 MED ORDER — ACCU-CHEK FASTCLIX LANCETS MISC: 1.0000 [IU] | Freq: Four times a day (QID) | Status: DC

## 2014-03-29 NOTE — Progress Notes (Signed)
Patient is doing well without complaints. CBGs fasting all within range; 2 hr pp all within range with the exception one value of 182 after breakfast. Will continue holding glyburide for now.  Explained the need to take baby ASA daily during pregnancy. FM/PTL precautions reviewed

## 2014-03-29 NOTE — Progress Notes (Signed)
Has only been taking PNV. Has not started glyburide or baby aspirin. Reports stopped glyburide because it gave her headaches/dizziness. Reports was told she can stop glyburide but continue baby aspirin. Informed patient she can obtain aspirin at [facility], [facility] etc-- wrote name of med down so patient may take it with her to the store Patient reports she has been checking her blood sugars.   Georga BoraLay Sha Mu used as interpreter for this encounter.

## 2014-03-30 LAB — POCT URINALYSIS DIP (DEVICE)
Bilirubin Urine: NEGATIVE
GLUCOSE, UA: NEGATIVE mg/dL
Hgb urine dipstick: NEGATIVE
Ketones, ur: NEGATIVE mg/dL
Leukocytes, UA: NEGATIVE
Nitrite: NEGATIVE
PROTEIN: NEGATIVE mg/dL
Specific Gravity, Urine: 1.015 (ref 1.005–1.030)
UROBILINOGEN UA: 0.2 mg/dL (ref 0.0–1.0)
pH: 7.5 (ref 5.0–8.0)

## 2014-04-05 ENCOUNTER — Ambulatory Visit (HOSPITAL_COMMUNITY)
Admission: RE | Admit: 2014-04-05 | Discharge: 2014-04-05 | Disposition: A | Discharge status: Home / Self Care | Payer: Medicaid Other | Source: Ambulatory Visit | Attending: Family Medicine | Admitting: Family Medicine

## 2014-04-05 DIAGNOSIS — IMO0002 Reserved for concepts with insufficient information to code with codable children: Secondary | ICD-10-CM | POA: Insufficient documentation

## 2014-04-05 DIAGNOSIS — O24419 Gestational diabetes mellitus in pregnancy, unspecified control: Secondary | ICD-10-CM | POA: Diagnosis present

## 2014-04-05 DIAGNOSIS — Z36 Encounter for antenatal screening of mother: Secondary | ICD-10-CM | POA: Insufficient documentation

## 2014-04-05 DIAGNOSIS — Z3A24 24 weeks gestation of pregnancy: Secondary | ICD-10-CM | POA: Insufficient documentation

## 2014-04-05 DIAGNOSIS — Z3689 Encounter for other specified antenatal screening: Secondary | ICD-10-CM

## 2014-04-05 DIAGNOSIS — O24912 Unspecified diabetes mellitus in pregnancy, second trimester: Secondary | ICD-10-CM

## 2014-04-05 DIAGNOSIS — Z0489 Encounter for examination and observation for other specified reasons: Secondary | ICD-10-CM | POA: Insufficient documentation

## 2014-04-11 ENCOUNTER — Other Ambulatory Visit (HOSPITAL_COMMUNITY): Payer: Self-pay | Admitting: Maternal and Fetal Medicine

## 2014-04-11 DIAGNOSIS — O24419 Gestational diabetes mellitus in pregnancy, unspecified control: Secondary | ICD-10-CM

## 2014-04-14 ENCOUNTER — Ambulatory Visit (INDEPENDENT_AMBULATORY_CARE_PROVIDER_SITE_OTHER): Payer: Medicaid Other | Admitting: Family

## 2014-04-14 VITALS — BP 112/62 | HR 88 | Temp 97.3°F | Wt 107.8 lb

## 2014-04-14 DIAGNOSIS — O24912 Unspecified diabetes mellitus in pregnancy, second trimester: Secondary | ICD-10-CM

## 2014-04-14 DIAGNOSIS — O24112 Pre-existing diabetes mellitus, type 2, in pregnancy, second trimester: Secondary | ICD-10-CM

## 2014-04-14 DIAGNOSIS — O0992 Supervision of high risk pregnancy, unspecified, second trimester: Secondary | ICD-10-CM

## 2014-04-14 LAB — POCT URINALYSIS DIP (DEVICE)
Bilirubin Urine: NEGATIVE
Glucose, UA: NEGATIVE mg/dL
Hgb urine dipstick: NEGATIVE
KETONES UR: NEGATIVE mg/dL
Nitrite: NEGATIVE
PH: 6.5 (ref 5.0–8.0)
Protein, ur: NEGATIVE mg/dL
SPECIFIC GRAVITY, URINE: 1.02 (ref 1.005–1.030)
Urobilinogen, UA: 0.2 mg/dL (ref 0.0–1.0)

## 2014-04-14 MED ORDER — GLUCOSE BLOOD VI STRP: ORAL_STRIP | Status: DC

## 2014-04-14 NOTE — Progress Notes (Signed)
Pt has not checked blood glucose due to not having strips.  RX sent for new strips.  Reviewed growth ultrasound at 24 wks (63%ile).  Will return in one week to review values.  HIV not collected this pregnancy (noted after review of records from health department after patient left) > OBTAIN AT NEXT VISIT (order already entered).

## 2014-04-14 NOTE — Progress Notes (Signed)
Patient states has not been checking BS due to wrong test strips at pharmacy; new test strips ordered Win khine used for interpreter

## 2014-04-26 ENCOUNTER — Encounter: Payer: Self-pay | Admitting: Obstetrics and Gynecology

## 2014-04-26 ENCOUNTER — Ambulatory Visit (INDEPENDENT_AMBULATORY_CARE_PROVIDER_SITE_OTHER): Payer: Medicaid Other | Admitting: Obstetrics and Gynecology

## 2014-04-26 VITALS — BP 113/61 | HR 84 | Temp 98.3°F | Wt 109.6 lb

## 2014-04-26 DIAGNOSIS — Z789 Other specified health status: Secondary | ICD-10-CM

## 2014-04-26 DIAGNOSIS — O0993 Supervision of high risk pregnancy, unspecified, third trimester: Secondary | ICD-10-CM

## 2014-04-26 DIAGNOSIS — O24913 Unspecified diabetes mellitus in pregnancy, third trimester: Secondary | ICD-10-CM

## 2014-04-26 DIAGNOSIS — Z23 Encounter for immunization: Secondary | ICD-10-CM

## 2014-04-26 LAB — CBC
HCT: 33.5 % — ABNORMAL LOW (ref 36.0–46.0)
HEMOGLOBIN: 11.3 g/dL — AB (ref 12.0–15.0)
MCH: 28 pg (ref 26.0–34.0)
MCHC: 33.7 g/dL (ref 30.0–36.0)
MCV: 83.1 fL (ref 78.0–100.0)
MPV: 9.3 fL (ref 8.6–12.4)
Platelets: 310 10*3/uL (ref 150–400)
RBC: 4.03 MIL/uL (ref 3.87–5.11)
RDW: 14.3 % (ref 11.5–15.5)
WBC: 10.8 10*3/uL — ABNORMAL HIGH (ref 4.0–10.5)

## 2014-04-26 LAB — POCT URINALYSIS DIP (DEVICE)
Bilirubin Urine: NEGATIVE
Glucose, UA: NEGATIVE mg/dL
HGB URINE DIPSTICK: NEGATIVE
Ketones, ur: NEGATIVE mg/dL
NITRITE: NEGATIVE
PH: 7.5 (ref 5.0–8.0)
PROTEIN: NEGATIVE mg/dL
Specific Gravity, Urine: 1.015 (ref 1.005–1.030)
Urobilinogen, UA: 0.2 mg/dL (ref 0.0–1.0)

## 2014-04-26 MED ORDER — ACCU-CHEK NANO SMARTVIEW W/DEVICE KIT: 1.0000 | PACK | Freq: Four times a day (QID) | Status: DC

## 2014-04-26 MED ORDER — GLUCOSE BLOOD VI STRP: ORAL_STRIP | Status: DC

## 2014-04-26 MED ORDER — ACCU-CHEK FASTCLIX LANCETS MISC: 1.0000 [IU] | Freq: Four times a day (QID) | Status: DC

## 2014-04-26 MED ORDER — TETANUS-DIPHTH-ACELL PERTUSSIS 5-2.5-18.5 LF-MCG/0.5 IM SUSP
0.5000 mL | Freq: Once | INTRAMUSCULAR | Status: AC
  Administered 2014-04-26: 0.5 mL via INTRAMUSCULAR

## 2014-04-26 NOTE — Progress Notes (Signed)
Patient is doing well without complaints. FM/PTL precautions reviewed. F/U growth ultrasound scheduled for 05/17/2014. Patient was not prescribe the right testing strips for her meter. New prescriptions provided. CBGS from first 2 weeks of January reviewed and all within range. Continue glyburide.

## 2014-04-26 NOTE — Progress Notes (Signed)
Used interpreter  

## 2014-04-26 NOTE — Addendum Note (Signed)
Addended by: Catalina AntiguaONSTANT, Taevon Aschoff on: 04/26/2014 01:57 PM   Modules accepted: Orders

## 2014-04-26 NOTE — Addendum Note (Signed)
Addended by: Kathee DeltonHILLMAN, CARRIE L on: 04/26/2014 02:26 PM   Modules accepted: Orders

## 2014-04-27 LAB — HIV ANTIBODY (ROUTINE TESTING W REFLEX): HIV 1&2 Ab, 4th Generation: NONREACTIVE

## 2014-04-27 LAB — RPR

## 2014-05-11 ENCOUNTER — Encounter: Payer: Self-pay | Admitting: Physician Assistant

## 2014-05-11 ENCOUNTER — Encounter: Payer: Self-pay | Admitting: *Deleted

## 2014-05-11 ENCOUNTER — Ambulatory Visit (INDEPENDENT_AMBULATORY_CARE_PROVIDER_SITE_OTHER): Payer: Medicaid Other | Admitting: Physician Assistant

## 2014-05-11 VITALS — BP 105/56 | HR 90 | Temp 98.1°F | Wt 112.4 lb

## 2014-05-11 DIAGNOSIS — O24919 Unspecified diabetes mellitus in pregnancy, unspecified trimester: Secondary | ICD-10-CM

## 2014-05-11 DIAGNOSIS — O0993 Supervision of high risk pregnancy, unspecified, third trimester: Secondary | ICD-10-CM

## 2014-05-11 DIAGNOSIS — Z789 Other specified health status: Secondary | ICD-10-CM

## 2014-05-11 DIAGNOSIS — O0990 Supervision of high risk pregnancy, unspecified, unspecified trimester: Secondary | ICD-10-CM

## 2014-05-11 LAB — POCT URINALYSIS DIP (DEVICE)
BILIRUBIN URINE: NEGATIVE
Glucose, UA: NEGATIVE mg/dL
HGB URINE DIPSTICK: NEGATIVE
KETONES UR: NEGATIVE mg/dL
NITRITE: NEGATIVE
Protein, ur: NEGATIVE mg/dL
Specific Gravity, Urine: 1.02 (ref 1.005–1.030)
Urobilinogen, UA: 0.2 mg/dL (ref 0.0–1.0)
pH: 7 (ref 5.0–8.0)

## 2014-05-11 NOTE — Patient Instructions (Signed)
Third Trimester of Pregnancy The third trimester is from week 29 through week 42, months 7 through 9. The third trimester is a time when the fetus is growing rapidly. At the end of the ninth month, the fetus is about 20 inches in length and weighs 6-10 pounds.  BODY CHANGES Your body goes through many changes during pregnancy. The changes vary from woman to woman.   Your weight will continue to increase. You can expect to gain 25-35 pounds (11-16 kg) by the end of the pregnancy.  You may begin to get stretch marks on your hips, abdomen, and breasts.  You may urinate more often because the fetus is moving lower into your pelvis and pressing on your bladder.  You may develop or continue to have heartburn as a result of your pregnancy.  You may develop constipation because certain hormones are causing the muscles that push waste through your intestines to slow down.  You may develop hemorrhoids or swollen, bulging veins (varicose veins).  You may have pelvic pain because of the weight gain and pregnancy hormones relaxing your joints between the bones in your pelvis. Backaches may result from overexertion of the muscles supporting your posture.  You may have changes in your hair. These can include thickening of your hair, rapid growth, and changes in texture. Some women also have hair loss during or after pregnancy, or hair that feels dry or thin. Your hair will most likely return to normal after your baby is born.  Your breasts will continue to grow and be tender. A yellow discharge may leak from your breasts called colostrum.  Your belly button may stick out.  You may feel short of breath because of your expanding uterus.  You may notice the fetus "dropping," or moving lower in your abdomen.  You may have a bloody mucus discharge. This usually occurs a few days to a week before labor begins.  Your cervix becomes thin and soft (effaced) near your due date. WHAT TO EXPECT AT YOUR PRENATAL  EXAMS  You will have prenatal exams every 2 weeks until week 36. Then, you will have weekly prenatal exams. During a routine prenatal visit:  You will be weighed to make sure you and the fetus are growing normally.  Your blood pressure is taken.  Your abdomen will be measured to track your baby's growth.  The fetal heartbeat will be listened to.  Any test results from the previous visit will be discussed.  You may have a cervical check near your due date to see if you have effaced. At around 36 weeks, your caregiver will check your cervix. At the same time, your caregiver will also perform a test on the secretions of the vaginal tissue. This test is to determine if a type of bacteria, Group B streptococcus, is present. Your caregiver will explain this further. Your caregiver may ask you:  What your birth plan is.  How you are feeling.  If you are feeling the baby move.  If you have had any abnormal symptoms, such as leaking fluid, bleeding, severe headaches, or abdominal cramping.  If you have any questions. Other tests or screenings that may be performed during your third trimester include:  Blood tests that check for low iron levels (anemia).  Fetal testing to check the health, activity level, and growth of the fetus. Testing is done if you have certain medical conditions or if there are problems during the pregnancy. FALSE LABOR You may feel small, irregular contractions that   eventually go away. These are called Braxton Hicks contractions, or false labor. Contractions may last for hours, days, or even weeks before true labor sets in. If contractions come at regular intervals, intensify, or become painful, it is best to be seen by your caregiver.  SIGNS OF LABOR   Menstrual-like cramps.  Contractions that are 5 minutes apart or less.  Contractions that start on the top of the uterus and spread down to the lower abdomen and back.  A sense of increased pelvic pressure or back  pain.  A watery or bloody mucus discharge that comes from the vagina. If you have any of these signs before the 37th week of pregnancy, call your caregiver right away. You need to go to the hospital to get checked immediately. HOME CARE INSTRUCTIONS   Avoid all smoking, herbs, alcohol, and unprescribed drugs. These chemicals affect the formation and growth of the baby.  Follow your caregiver's instructions regarding medicine use. There are medicines that are either safe or unsafe to take during pregnancy.  Exercise only as directed by your caregiver. Experiencing uterine cramps is a good sign to stop exercising.  Continue to eat regular, healthy meals.  Wear a good support bra for breast tenderness.  Do not use hot tubs, steam rooms, or saunas.  Wear your seat belt at all times when driving.  Avoid raw meat, uncooked cheese, cat litter boxes, and soil used by cats. These carry germs that can cause birth defects in the baby.  Take your prenatal vitamins.  Try taking a stool softener (if your caregiver approves) if you develop constipation. Eat more high-fiber foods, such as fresh vegetables or fruit and whole grains. Drink plenty of fluids to keep your urine clear or pale yellow.  Take warm sitz baths to soothe any pain or discomfort caused by hemorrhoids. Use hemorrhoid cream if your caregiver approves.  If you develop varicose veins, wear support hose. Elevate your feet for 15 minutes, 3-4 times a day. Limit salt in your diet.  Avoid heavy lifting, wear low heal shoes, and practice good posture.  Rest a lot with your legs elevated if you have leg cramps or low back pain.  Visit your dentist if you have not gone during your pregnancy. Use a soft toothbrush to brush your teeth and be gentle when you floss.  A sexual relationship may be continued unless your caregiver directs you otherwise.  Do not travel far distances unless it is absolutely necessary and only with the approval  of your caregiver.  Take prenatal classes to understand, practice, and ask questions about the labor and delivery.  Make a trial run to the hospital.  Pack your hospital bag.  Prepare the baby's nursery.  Continue to go to all your prenatal visits as directed by your caregiver. SEEK MEDICAL CARE IF:  You are unsure if you are in labor or if your water has broken.  You have dizziness.  You have mild pelvic cramps, pelvic pressure, or nagging pain in your abdominal area.  You have persistent nausea, vomiting, or diarrhea.  You have a bad smelling vaginal discharge.  You have pain with urination. SEEK IMMEDIATE MEDICAL CARE IF:   You have a fever.  You are leaking fluid from your vagina.  You have spotting or bleeding from your vagina.  You have severe abdominal cramping or pain.  You have rapid weight loss or gain.  You have shortness of breath with chest pain.  You notice sudden or extreme swelling   of your face, hands, ankles, feet, or legs.  You have not felt your baby move in over an hour.  You have severe headaches that do not go away with medicine.  You have vision changes. Document Released: 02/26/2001 Document Revised: 03/09/2013 Document Reviewed: 05/05/2012 Exit[employer] Patient Information 2015 [employer]. This information is not intended to replace advice given to you by your health care provider. Make sure you discuss any questions you have with your health care provider.  

## 2014-05-11 NOTE — Progress Notes (Signed)
Used Interpreter Win Khine. 

## 2014-05-11 NOTE — Progress Notes (Signed)
29 weeks, stable.  Blood sugar diary reviewed.  All values are acceptable.  Not taking aspirin.  Denies complaint.  Endorses good fetal movement and denies vaginal bleeding, dysuria, LOF.   Encouraged to take aspirin 81mg  daily.  PNV qd.  RTC 2 weeks    Pt seen by Audry Piliyler Mohr, PA-S, [place].   I have seen and evaluated the patient with the PA student. I agree with the assessment and plan as written above.   Bertram DenverKaren E Teague Clark, PA-C  05/11/2014 9:48 AM

## 2014-05-17 ENCOUNTER — Encounter: Payer: Self-pay | Admitting: General Practice

## 2014-05-17 ENCOUNTER — Ambulatory Visit (HOSPITAL_COMMUNITY)
Admission: RE | Admit: 2014-05-17 | Discharge: 2014-05-17 | Disposition: A | Discharge status: Home / Self Care | Payer: Medicaid Other | Source: Ambulatory Visit | Attending: Family Medicine | Admitting: Family Medicine

## 2014-05-17 ENCOUNTER — Encounter (HOSPITAL_COMMUNITY): Payer: Self-pay

## 2014-05-17 ENCOUNTER — Other Ambulatory Visit (HOSPITAL_COMMUNITY): Payer: Self-pay | Admitting: Maternal and Fetal Medicine

## 2014-05-17 DIAGNOSIS — O24419 Gestational diabetes mellitus in pregnancy, unspecified control: Secondary | ICD-10-CM | POA: Diagnosis not present

## 2014-05-17 DIAGNOSIS — Z3A30 30 weeks gestation of pregnancy: Secondary | ICD-10-CM | POA: Insufficient documentation

## 2014-05-17 NOTE — ED Notes (Signed)
Language interpreter Win Khine with pt. 

## 2014-05-20 ENCOUNTER — Encounter: Payer: Self-pay | Admitting: *Deleted

## 2014-05-23 ENCOUNTER — Encounter: Payer: Self-pay | Admitting: *Deleted

## 2014-05-23 ENCOUNTER — Encounter: Payer: Self-pay | Admitting: Family Medicine

## 2014-05-23 ENCOUNTER — Ambulatory Visit (INDEPENDENT_AMBULATORY_CARE_PROVIDER_SITE_OTHER): Payer: Medicaid Other | Admitting: Family Medicine

## 2014-05-23 VITALS — BP 107/67 | HR 91 | Temp 98.0°F | Wt 113.8 lb

## 2014-05-23 DIAGNOSIS — O24913 Unspecified diabetes mellitus in pregnancy, third trimester: Secondary | ICD-10-CM

## 2014-05-23 DIAGNOSIS — O0993 Supervision of high risk pregnancy, unspecified, third trimester: Secondary | ICD-10-CM

## 2014-05-23 LAB — POCT URINALYSIS DIP (DEVICE)
Bilirubin Urine: NEGATIVE
Glucose, UA: NEGATIVE mg/dL
HGB URINE DIPSTICK: NEGATIVE
Ketones, ur: NEGATIVE mg/dL
Nitrite: NEGATIVE
PROTEIN: NEGATIVE mg/dL
SPECIFIC GRAVITY, URINE: 1.015 (ref 1.005–1.030)
Urobilinogen, UA: 0.2 mg/dL (ref 0.0–1.0)
pH: 7 (ref 5.0–8.0)

## 2014-05-23 LAB — GLUCOSE, CAPILLARY: Glucose-Capillary: 88 mg/dL (ref 70–99)

## 2014-05-23 MED ORDER — GLUCOSE BLOOD VI STRP: ORAL_STRIP | Status: DC

## 2014-05-23 MED ORDER — ACCU-CHEK FASTCLIX LANCETS MISC: 1.0000 [IU] | Freq: Four times a day (QID) | Status: DC

## 2014-05-23 NOTE — Progress Notes (Signed)
Interpreter present for encounter, Pt states she has not checked her blood sugar in the past 2 weeks, was told they would check it today. Pt desires to see Nutrition today

## 2014-05-23 NOTE — Patient Instructions (Signed)
Gestational Diabetes Mellitus Gestational diabetes mellitus, often simply referred to as gestational diabetes, is a type of diabetes that some women develop during pregnancy. In gestational diabetes, the pancreas does not make enough insulin (a hormone), the cells are less responsive to the insulin that is made (insulin resistance), or both.Normally, insulin moves sugars from food into the tissue cells. The tissue cells use the sugars for energy. The lack of insulin or the lack of normal response to insulin causes excess sugars to build up in the blood instead of going into the tissue cells. As a result, high blood sugar (hyperglycemia) develops. The effect of high sugar (glucose) levels can cause many problems.  RISK FACTORS You have an increased chance of developing gestational diabetes if you have a family history of diabetes and also have one or more of the following risk factors:  A body mass index over 30 (obesity).  A previous pregnancy with gestational diabetes.  An older age at the time of pregnancy. If blood glucose levels are kept in the normal range during pregnancy, women can have a healthy pregnancy. If your blood glucose levels are not well controlled, there may be risks to you, your unborn baby (fetus), your labor and delivery, or your newborn baby.  SYMPTOMS  If symptoms are experienced, they are much like symptoms you would normally expect during pregnancy. The symptoms of gestational diabetes include:   Increased thirst (polydipsia).  Increased urination (polyuria).  Increased urination during the night (nocturia).  Weight loss. This weight loss may be rapid.  Frequent, recurring infections.  Tiredness (fatigue).  Weakness.  Vision changes, such as blurred vision.  Fruity smell to your breath.  Abdominal pain. DIAGNOSIS Diabetes is diagnosed when blood glucose levels are increased. Your blood glucose level may be checked by one or more of the following blood  tests:  A fasting blood glucose test. You will not be allowed to eat for at least 8 hours before a blood sample is taken.  A random blood glucose test. Your blood glucose is checked at any time of the day regardless of when you ate.  A hemoglobin A1c blood glucose test. A hemoglobin A1c test provides information about blood glucose control over the previous 3 months.  An oral glucose tolerance test (OGTT). Your blood glucose is measured after you have not eaten (fasted) for 1-3 hours and then after you drink a glucose-containing beverage. Since the hormones that cause insulin resistance are highest at about 24-28 weeks of a pregnancy, an OGTT is usually performed during that time. If you have risk factors for gestational diabetes, your health care provider may test you for gestational diabetes earlier than 24 weeks of pregnancy. TREATMENT   You will need to take diabetes medicine or insulin daily to keep blood glucose levels in the desired range.  You will need to match insulin dosing with exercise and healthy food choices. The treatment goal is to maintain the before-meal (preprandial), bedtime, and overnight blood glucose level at 60-99 mg/dL during pregnancy. The treatment goal is to further maintain peak after-meal blood sugar (postprandial glucose) level at 100-140 mg/dL. HOME CARE INSTRUCTIONS   Have your hemoglobin A1c level checked twice a year.  Perform daily blood glucose monitoring as directed by your health care provider. It is common to perform frequent blood glucose monitoring.  Monitor urine ketones when you are ill and as directed by your health care provider.  Take your diabetes medicine and insulin as directed by your health care provider   to maintain your blood glucose level in the desired range.  Never run out of diabetes medicine or insulin. It is needed every day.  Adjust insulin based on your intake of carbohydrates. Carbohydrates can raise blood glucose levels but  need to be included in your diet. Carbohydrates provide vitamins, minerals, and fiber which are an essential part of a healthy diet. Carbohydrates are found in fruits, vegetables, whole grains, dairy products, legumes, and foods containing added sugars.  Eat healthy foods. Alternate 3 meals with 3 snacks.  Maintain a healthy weight gain. The usual total expected weight gain varies according to your prepregnancy body mass index (BMI).  Carry a medical alert card or wear your medical alert jewelry.  Carry a 15-gram carbohydrate snack with you at all times to treat low blood glucose (hypoglycemia). Some examples of 15-gram carbohydrate snacks include:  Glucose tablets, 3 or 4.  Glucose gel, 15-gram tube.  Raisins, 2 tablespoons (24 g).  Jelly beans, 6.  Animal crackers, 8.  Fruit juice, regular soda, or low-fat milk, 4 ounces (120 mL).  Gummy treats, 9.  Recognize hypoglycemia. Hypoglycemia during pregnancy occurs with blood glucose levels of 60 mg/dL and below. The risk for hypoglycemia increases when fasting or skipping meals, during or after intense exercise, and during sleep. Hypoglycemia symptoms can include:  Tremors or shakes.  Decreased ability to concentrate.  Sweating.  Increased heart rate.  Headache.  Dry mouth.  Hunger.  Irritability.  Anxiety.  Restless sleep.  Altered speech or coordination.  Confusion.  Treat hypoglycemia promptly. If you are alert and able to safely swallow, follow the 15:15 rule:  Take 15-20 grams of rapid-acting glucose or carbohydrate. Rapid-acting options include glucose gel, glucose tablets, or 4 ounces (120 mL) of fruit juice, regular soda, or low-fat milk.  Check your blood glucose level 15 minutes after taking the glucose.  Take 15-20 grams more of glucose if the repeat blood glucose level is still 70 mg/dL or below.  Eat a meal or snack within 1 hour once blood glucose levels return to normal.  Be alert to polyuria  (excess urination) and polydipsia (excess thirst) which are early signs of hyperglycemia. An early awareness of hyperglycemia allows for prompt treatment. Treat hyperglycemia as directed by your health care provider.  Engage in at least 30 minutes of physical activity a day or as directed by your health care provider. Ten minutes of physical activity timed 30 minutes after each meal is encouraged to control postprandial blood glucose levels.  Adjust your insulin dosing and food intake as needed if you start a new exercise or sport.  Follow your sick-day plan at any time you are unable to eat or drink as usual.  Avoid tobacco and alcohol use.  Keep all follow-up visits as directed by your health care provider.  Follow the advice of your health care provider regarding your prenatal and post-delivery (postpartum) appointments, meal planning, exercise, medicines, vitamins, blood tests, other medical tests, and physical activities.  Perform daily skin and foot care. Examine your skin and feet daily for cuts, bruises, redness, nail problems, bleeding, blisters, or sores.  Brush your teeth and gums at least twice a day and floss at least once a day. Follow up with your dentist regularly.  Schedule an eye exam during the first trimester of your pregnancy or as directed by your health care provider.  Share your diabetes management plan with your workplace or school.  Stay up-to-date with immunizations.  Learn to manage stress.    Obtain ongoing diabetes education and support as needed.  Learn about and consider breastfeeding your baby.  You should have your blood sugar level checked 6-12 weeks after delivery. This is done with an oral glucose tolerance test (OGTT). SEEK MEDICAL CARE IF:   You are unable to eat food or drink fluids for more than 6 hours.  You have nausea and vomiting for more than 6 hours.  You have a blood glucose level of 200 mg/dL and you have ketones in your  urine.  There is a change in mental status.  You develop vision problems.  You have a persistent headache.  You have upper abdominal pain or discomfort.  You develop an additional serious illness.  You have diarrhea for more than 6 hours.  You have been sick or have had a fever for a couple of days and are not getting better. SEEK IMMEDIATE MEDICAL CARE IF:   You have difficulty breathing.  You no longer feel the baby moving.  You are bleeding or have discharge from your vagina.  You start having premature contractions or labor. MAKE SURE YOU:  Understand these instructions.  Will watch your condition.  Will get help right away if you are not doing well or get worse. Document Released: 06/10/2000 Document Revised: 07/19/2013 Document Reviewed: 10/01/2011 [employer] Patient Information 2015 [employer]. This information is not intended to replace advice given to you by your health care provider. Make sure you discuss any questions you have with your health care provider.  Third Trimester of Pregnancy The third trimester is from week 29 through week 42, months 7 through 9. The third trimester is a time when the fetus is growing rapidly. At the end of the ninth month, the fetus is about 20 inches in length and weighs 6-10 pounds.  BODY CHANGES Your body goes through many changes during pregnancy. The changes vary from woman to woman.   Your weight will continue to increase. You can expect to gain 25-35 pounds (11-16 kg) by the end of the pregnancy.  You may begin to get stretch marks on your hips, abdomen, and breasts.  You may urinate more often because the fetus is moving lower into your pelvis and pressing on your bladder.  You may develop or continue to have heartburn as a result of your pregnancy.  You may develop constipation because certain hormones are causing the muscles that push waste through your intestines to slow down.  You may develop hemorrhoids or  swollen, bulging veins (varicose veins).  You may have pelvic pain because of the weight gain and pregnancy hormones relaxing your joints between the bones in your pelvis. Backaches may result from overexertion of the muscles supporting your posture.  You may have changes in your hair. These can include thickening of your hair, rapid growth, and changes in texture. Some women also have hair loss during or after pregnancy, or hair that feels dry or thin. Your hair will most likely return to normal after your baby is born.  Your breasts will continue to grow and be tender. A yellow discharge may leak from your breasts called colostrum.  Your belly button may stick out.  You may feel short of breath because of your expanding uterus.  You may notice the fetus "dropping," or moving lower in your abdomen.  You may have a bloody mucus discharge. This usually occurs a few days to a week before labor begins.  Your cervix becomes thin and soft (effaced) near your due   date. WHAT TO EXPECT AT YOUR PRENATAL EXAMS  You will have prenatal exams every 2 weeks until week 36. Then, you will have weekly prenatal exams. During a routine prenatal visit:  You will be weighed to make sure you and the fetus are growing normally.  Your blood pressure is taken.  Your abdomen will be measured to track your baby's growth.  The fetal heartbeat will be listened to.  Any test results from the previous visit will be discussed.  You may have a cervical check near your due date to see if you have effaced. At around 36 weeks, your caregiver will check your cervix. At the same time, your caregiver will also perform a test on the secretions of the vaginal tissue. This test is to determine if a type of bacteria, Group B streptococcus, is present. Your caregiver will explain this further. Your caregiver may ask you:  What your birth plan is.  How you are feeling.  If you are feeling the baby move.  If you have had  any abnormal symptoms, such as leaking fluid, bleeding, severe headaches, or abdominal cramping.  If you have any questions. Other tests or screenings that may be performed during your third trimester include:  Blood tests that check for low iron levels (anemia).  Fetal testing to check the health, activity level, and growth of the fetus. Testing is done if you have certain medical conditions or if there are problems during the pregnancy. FALSE LABOR You may feel small, irregular contractions that eventually go away. These are called Braxton Hicks contractions, or false labor. Contractions may last for hours, days, or even weeks before true labor sets in. If contractions come at regular intervals, intensify, or become painful, it is best to be seen by your caregiver.  SIGNS OF LABOR   Menstrual-like cramps.  Contractions that are 5 minutes apart or less.  Contractions that start on the top of the uterus and spread down to the lower abdomen and back.  A sense of increased pelvic pressure or back pain.  A watery or bloody mucus discharge that comes from the vagina. If you have any of these signs before the 37th week of pregnancy, call your caregiver right away. You need to go to the hospital to get checked immediately. HOME CARE INSTRUCTIONS   Avoid all smoking, herbs, alcohol, and unprescribed drugs. These chemicals affect the formation and growth of the baby.  Follow your caregiver's instructions regarding medicine use. There are medicines that are either safe or unsafe to take during pregnancy.  Exercise only as directed by your caregiver. Experiencing uterine cramps is a good sign to stop exercising.  Continue to eat regular, healthy meals.  Wear a good support bra for breast tenderness.  Do not use hot tubs, steam rooms, or saunas.  Wear your seat belt at all times when driving.  Avoid raw meat, uncooked cheese, cat litter boxes, and soil used by cats. These carry germs that  can cause birth defects in the baby.  Take your prenatal vitamins.  Try taking a stool softener (if your caregiver approves) if you develop constipation. Eat more high-fiber foods, such as fresh vegetables or fruit and whole grains. Drink plenty of fluids to keep your urine clear or pale yellow.  Take warm sitz baths to soothe any pain or discomfort caused by hemorrhoids. Use hemorrhoid cream if your caregiver approves.  If you develop varicose veins, wear support hose. Elevate your feet for 15 minutes, 3-4 times a   day. Limit salt in your diet.  Avoid heavy lifting, wear low heal shoes, and practice good posture.  Rest a lot with your legs elevated if you have leg cramps or low back pain.  Visit your dentist if you have not gone during your pregnancy. Use a soft toothbrush to brush your teeth and be gentle when you floss.  A sexual relationship may be continued unless your caregiver directs you otherwise.  Do not travel far distances unless it is absolutely necessary and only with the approval of your caregiver.  Take prenatal classes to understand, practice, and ask questions about the labor and delivery.  Make a trial run to the hospital.  Pack your hospital bag.  Prepare the baby's nursery.  Continue to go to all your prenatal visits as directed by your caregiver. SEEK MEDICAL CARE IF:  You are unsure if you are in labor or if your water has broken.  You have dizziness.  You have mild pelvic cramps, pelvic pressure, or nagging pain in your abdominal area.  You have persistent nausea, vomiting, or diarrhea.  You have a bad smelling vaginal discharge.  You have pain with urination. SEEK IMMEDIATE MEDICAL CARE IF:   You have a fever.  You are leaking fluid from your vagina.  You have spotting or bleeding from your vagina.  You have severe abdominal cramping or pain.  You have rapid weight loss or gain.  You have shortness of breath with chest pain.  You notice  sudden or extreme swelling of your face, hands, ankles, feet, or legs.  You have not felt your baby move in over an hour.  You have severe headaches that do not go away with medicine.  You have vision changes. Document Released: 02/26/2001 Document Revised: 03/09/2013 Document Reviewed: 05/05/2012 [employer] Patient Information 2015 [employer]. This information is not intended to replace advice given to you by your health care provider. Make sure you discuss any questions you have with your health care provider.  Breastfeeding Deciding to breastfeed is one of the best choices you can make for you and your baby. A change in hormones during pregnancy causes your breast tissue to grow and increases the number and size of your milk ducts. These hormones also allow proteins, sugars, and fats from your blood supply to make breast milk in your milk-producing glands. Hormones prevent breast milk from being released before your baby is born as well as prompt milk flow after birth. Once breastfeeding has begun, thoughts of your baby, as well as his or her sucking or crying, can stimulate the release of milk from your milk-producing glands.  BENEFITS OF BREASTFEEDING For Your Baby  Your first milk (colostrum) helps your baby's digestive system function better.   There are antibodies in your milk that help your baby fight off infections.   Your baby has a lower incidence of asthma, allergies, and sudden infant death syndrome.   The nutrients in breast milk are better for your baby than infant formulas and are designed uniquely for your baby's needs.   Breast milk improves your baby's brain development.   Your baby is less likely to develop other conditions, such as childhood obesity, asthma, or type 2 diabetes mellitus.  For You   Breastfeeding helps to create a very special bond between you and your baby.   Breastfeeding is convenient. Breast milk is always available at the correct  temperature and costs nothing.   Breastfeeding helps to burn calories and helps you lose   the weight gained during pregnancy.   Breastfeeding makes your uterus contract to its prepregnancy size faster and slows bleeding (lochia) after you give birth.   Breastfeeding helps to lower your risk of developing type 2 diabetes mellitus, osteoporosis, and breast or ovarian cancer later in life. SIGNS THAT YOUR BABY IS HUNGRY Early Signs of Hunger  Increased alertness or activity.  Stretching.  Movement of the head from side to side.  Movement of the head and opening of the mouth when the corner of the mouth or cheek is stroked (rooting).  Increased sucking sounds, smacking lips, cooing, sighing, or squeaking.  Hand-to-mouth movements.  Increased sucking of fingers or hands. Late Signs of Hunger  Fussing.  Intermittent crying. Extreme Signs of Hunger Signs of extreme hunger will require calming and consoling before your baby will be able to breastfeed successfully. Do not wait for the following signs of extreme hunger to occur before you initiate breastfeeding:   Restlessness.  A loud, strong cry.   Screaming. BREASTFEEDING BASICS Breastfeeding Initiation  Find a comfortable place to sit or lie down, with your neck and back well supported.  Place a pillow or rolled up blanket under your baby to bring him or her to the level of your breast (if you are seated). Nursing pillows are specially designed to help support your arms and your baby while you breastfeed.  Make sure that your baby's abdomen is facing your abdomen.   Gently massage your breast. With your fingertips, massage from your chest wall toward your nipple in a circular motion. This encourages milk flow. You may need to continue this action during the feeding if your milk flows slowly.  Support your breast with 4 fingers underneath and your thumb above your nipple. Make sure your fingers are well away from your  nipple and your baby's mouth.   Stroke your baby's lips gently with your finger or nipple.   When your baby's mouth is open wide enough, quickly bring your baby to your breast, placing your entire nipple and as much of the colored area around your nipple (areola) as possible into your baby's mouth.   More areola should be visible above your baby's upper lip than below the lower lip.   Your baby's tongue should be between his or her lower gum and your breast.   Ensure that your baby's mouth is correctly positioned around your nipple (latched). Your baby's lips should create a seal on your breast and be turned out (everted).  It is common for your baby to suck about 2-3 minutes in order to start the flow of breast milk. Latching Teaching your baby how to latch on to your breast properly is very important. An improper latch can cause nipple pain and decreased milk supply for you and poor weight gain in your baby. Also, if your baby is not latched onto your nipple properly, he or she may swallow some air during feeding. This can make your baby fussy. Burping your baby when you switch breasts during the feeding can help to get rid of the air. However, teaching your baby to latch on properly is still the best way to prevent fussiness from swallowing air while breastfeeding. Signs that your baby has successfully latched on to your nipple:    Silent tugging or silent sucking, without causing you pain.   Swallowing heard between every 3-4 sucks.    Muscle movement above and in front of his or her ears while sucking.  Signs that your   baby has not successfully latched on to nipple:   Sucking sounds or smacking sounds from your baby while breastfeeding.  Nipple pain. If you think your baby has not latched on correctly, slip your finger into the corner of your baby's mouth to break the suction and place it between your baby's gums. Attempt breastfeeding initiation again. Signs of Successful  Breastfeeding Signs from your baby:   A gradual decrease in the number of sucks or complete cessation of sucking.   Falling asleep.   Relaxation of his or her body.   Retention of a small amount of milk in his or her mouth.   Letting go of your breast by himself or herself. Signs from you:  Breasts that have increased in firmness, weight, and size 1-3 hours after feeding.   Breasts that are softer immediately after breastfeeding.  Increased milk volume, as well as a change in milk consistency and color by the fifth day of breastfeeding.   Nipples that are not sore, cracked, or bleeding. Signs That Your Baby is Getting Enough Milk  Wetting at least 3 diapers in a 24-hour period. The urine should be clear and pale yellow by age 5 days.  At least 3 stools in a 24-hour period by age 5 days. The stool should be soft and yellow.  At least 3 stools in a 24-hour period by age 7 days. The stool should be seedy and yellow.  No loss of weight greater than 10% of birth weight during the first 3 days of age.  Average weight gain of 4-7 ounces (113-198 g) per week after age 4 days.  Consistent daily weight gain by age 5 days, without weight loss after the age of 2 weeks. After a feeding, your baby may spit up a small amount. This is common. BREASTFEEDING FREQUENCY AND DURATION Frequent feeding will help you make more milk and can prevent sore nipples and breast engorgement. Breastfeed when you feel the need to reduce the fullness of your breasts or when your baby shows signs of hunger. This is called "breastfeeding on demand." Avoid introducing a pacifier to your baby while you are working to establish breastfeeding (the first 4-6 weeks after your baby is born). After this time you may choose to use a pacifier. Research has shown that pacifier use during the first year of a baby's life decreases the risk of sudden infant death syndrome (SIDS). Allow your baby to feed on each breast as  long as he or she wants. Breastfeed until your baby is finished feeding. When your baby unlatches or falls asleep while feeding from the first breast, offer the second breast. Because newborns are often sleepy in the first few weeks of life, you may need to awaken your baby to get him or her to feed. Breastfeeding times will vary from baby to baby. However, the following rules can serve as a guide to help you ensure that your baby is properly fed:  Newborns (babies 4 weeks of age or younger) may breastfeed every 1-3 hours.  Newborns should not go longer than 3 hours during the day or 5 hours during the night without breastfeeding.  You should breastfeed your baby a minimum of 8 times in a 24-hour period until you begin to introduce solid foods to your baby at around 6 months of age. BREAST MILK PUMPING Pumping and storing breast milk allows you to ensure that your baby is exclusively fed your breast milk, even at times when you are unable to   breastfeed. This is especially important if you are going back to work while you are still breastfeeding or when you are not able to be present during feedings. Your lactation consultant can give you guidelines on how long it is safe to store breast milk.  A breast pump is a machine that allows you to pump milk from your breast into a sterile bottle. The pumped breast milk can then be stored in a refrigerator or freezer. Some breast pumps are operated by hand, while others use electricity. Ask your lactation consultant which type will work best for you. Breast pumps can be purchased, but some hospitals and breastfeeding support groups lease breast pumps on a monthly basis. A lactation consultant can teach you how to hand express breast milk, if you prefer not to use a pump.  CARING FOR YOUR BREASTS WHILE YOU BREASTFEED Nipples can become dry, cracked, and sore while breastfeeding. The following recommendations can help keep your breasts moisturized and  healthy:  Avoid using soap on your nipples.   Wear a supportive bra. Although not required, special nursing bras and tank tops are designed to allow access to your breasts for breastfeeding without taking off your entire bra or top. Avoid wearing underwire-style bras or extremely tight bras.  Air dry your nipples for 3-4minutes after each feeding.   Use only cotton bra pads to absorb leaked breast milk. Leaking of breast milk between feedings is normal.   Use lanolin on your nipples after breastfeeding. Lanolin helps to maintain your skin's normal moisture barrier. If you use pure lanolin, you do not need to wash it off before feeding your baby again. Pure lanolin is not toxic to your baby. You may also hand express a few drops of breast milk and gently massage that milk into your nipples and allow the milk to air dry. In the first few weeks after giving birth, some women experience extremely full breasts (engorgement). Engorgement can make your breasts feel heavy, warm, and tender to the touch. Engorgement peaks within 3-5 days after you give birth. The following recommendations can help ease engorgement:  Completely empty your breasts while breastfeeding or pumping. You may want to start by applying warm, moist heat (in the shower or with warm water-soaked hand towels) just before feeding or pumping. This increases circulation and helps the milk flow. If your baby does not completely empty your breasts while breastfeeding, pump any extra milk after he or she is finished.  Wear a snug bra (nursing or regular) or tank top for 1-2 days to signal your body to slightly decrease milk production.  Apply ice packs to your breasts, unless this is too uncomfortable for you.  Make sure that your baby is latched on and positioned properly while breastfeeding. If engorgement persists after 48 hours of following these recommendations, contact your health care provider or a lactation consultant. OVERALL  HEALTH CARE RECOMMENDATIONS WHILE BREASTFEEDING  Eat healthy foods. Alternate between meals and snacks, eating 3 of each per day. Because what you eat affects your breast milk, some of the foods may make your baby more irritable than usual. Avoid eating these foods if you are sure that they are negatively affecting your baby.  Drink milk, fruit juice, and water to satisfy your thirst (about 10 glasses a day).   Rest often, relax, and continue to take your prenatal vitamins to prevent fatigue, stress, and anemia.  Continue breast self-awareness checks.  Avoid chewing and smoking tobacco.  Avoid alcohol and drug use.   Some medicines that may be harmful to your baby can pass through breast milk. It is important to ask your health care provider before taking any medicine, including all over-the-counter and prescription medicine as well as vitamin and herbal supplements. It is possible to become pregnant while breastfeeding. If birth control is desired, ask your health care provider about options that will be safe for your baby. SEEK MEDICAL CARE IF:   You feel like you want to stop breastfeeding or have become frustrated with breastfeeding.  You have painful breasts or nipples.  Your nipples are cracked or bleeding.  Your breasts are red, tender, or warm.  You have a swollen area on either breast.  You have a fever or chills.  You have nausea or vomiting.  You have drainage other than breast milk from your nipples.  Your breasts do not become full before feedings by the fifth day after you give birth.  You feel sad and depressed.  Your baby is too sleepy to eat well.  Your baby is having trouble sleeping.   Your baby is wetting less than 3 diapers in a 24-hour period.  Your baby has less than 3 stools in a 24-hour period.  Your baby's skin or the white part of his or her eyes becomes yellow.   Your baby is not gaining weight by 5 days of age. SEEK IMMEDIATE MEDICAL CARE  IF:   Your baby is overly tired (lethargic) and does not want to wake up and feed.  Your baby develops an unexplained fever. Document Released: 03/04/2005 Document Revised: 03/09/2013 Document Reviewed: 08/26/2012 ExitCare Patient Information 2015 [employer]. This information is not intended to replace advice given to you by your health care provider. Make sure you discuss any questions you have with your health care provider.  

## 2014-05-23 NOTE — Progress Notes (Signed)
BTL consent reviewed and signed.  Burmese interpreter Win Khine present.

## 2014-05-23 NOTE — Progress Notes (Signed)
Brings no book States they are normal. Begin 2x/wk testing due to class B DM--next week U/s growth on 3/1--1805 gms 75%, AFI 20, vtx, right lacrimal duct cyst Burmese interpreter: Win Khine used

## 2014-05-30 ENCOUNTER — Ambulatory Visit (INDEPENDENT_AMBULATORY_CARE_PROVIDER_SITE_OTHER): Payer: Medicaid Other | Admitting: Family Medicine

## 2014-05-30 VITALS — BP 120/67 | HR 97 | Temp 97.8°F | Wt 112.7 lb

## 2014-05-30 DIAGNOSIS — O24913 Unspecified diabetes mellitus in pregnancy, third trimester: Secondary | ICD-10-CM

## 2014-05-30 DIAGNOSIS — O0993 Supervision of high risk pregnancy, unspecified, third trimester: Secondary | ICD-10-CM

## 2014-05-30 LAB — POCT URINALYSIS DIP (DEVICE)
Bilirubin Urine: NEGATIVE
Glucose, UA: NEGATIVE mg/dL
KETONES UR: NEGATIVE mg/dL
Leukocytes, UA: NEGATIVE
Nitrite: NEGATIVE
PROTEIN: NEGATIVE mg/dL
SPECIFIC GRAVITY, URINE: 1.02 (ref 1.005–1.030)
UROBILINOGEN UA: 0.2 mg/dL (ref 0.0–1.0)
pH: 7.5 (ref 5.0–8.0)

## 2014-05-30 NOTE — Progress Notes (Signed)
Pt requests assistance with obtaining baby clothes, car seat and other items. She also states she desires BTS - will see Arline AspCindy -SW today

## 2014-05-30 NOTE — Progress Notes (Signed)
Burmese interpreter [employer] Lots[employer]ree Moo NST reviewed and reactive. FBS 70-82 2 hour pp 81-148 (6 of 18 are high)

## 2014-05-30 NOTE — Progress Notes (Signed)
[employer] LotsShree Moo used as interpreter for this encounter.

## 2014-05-30 NOTE — Patient Instructions (Signed)
Gestational Diabetes Mellitus Gestational diabetes mellitus, often simply referred to as gestational diabetes, is a type of diabetes that some women develop during pregnancy. In gestational diabetes, the pancreas does not make enough insulin (a hormone), the cells are less responsive to the insulin that is made (insulin resistance), or both.Normally, insulin moves sugars from food into the tissue cells. The tissue cells use the sugars for energy. The lack of insulin or the lack of normal response to insulin causes excess sugars to build up in the blood instead of going into the tissue cells. As a result, high blood sugar (hyperglycemia) develops. The effect of high sugar (glucose) levels can cause many problems.  RISK FACTORS You have an increased chance of developing gestational diabetes if you have a family history of diabetes and also have one or more of the following risk factors:  A body mass index over 30 (obesity).  A previous pregnancy with gestational diabetes.  An older age at the time of pregnancy. If blood glucose levels are kept in the normal range during pregnancy, women can have a healthy pregnancy. If your blood glucose levels are not well controlled, there may be risks to you, your unborn baby (fetus), your labor and delivery, or your newborn baby.  SYMPTOMS  If symptoms are experienced, they are much like symptoms you would normally expect during pregnancy. The symptoms of gestational diabetes include:   Increased thirst (polydipsia).  Increased urination (polyuria).  Increased urination during the night (nocturia).  Weight loss. This weight loss may be rapid.  Frequent, recurring infections.  Tiredness (fatigue).  Weakness.  Vision changes, such as blurred vision.  Fruity smell to your breath.  Abdominal pain. DIAGNOSIS Diabetes is diagnosed when blood glucose levels are increased. Your blood glucose level may be checked by one or more of the following blood  tests:  A fasting blood glucose test. You will not be allowed to eat for at least 8 hours before a blood sample is taken.  A random blood glucose test. Your blood glucose is checked at any time of the day regardless of when you ate.  A hemoglobin A1c blood glucose test. A hemoglobin A1c test provides information about blood glucose control over the previous 3 months.  An oral glucose tolerance test (OGTT). Your blood glucose is measured after you have not eaten (fasted) for 1-3 hours and then after you drink a glucose-containing beverage. Since the hormones that cause insulin resistance are highest at about 24-28 weeks of a pregnancy, an OGTT is usually performed during that time. If you have risk factors for gestational diabetes, your health care provider may test you for gestational diabetes earlier than 24 weeks of pregnancy. TREATMENT   You will need to take diabetes medicine or insulin daily to keep blood glucose levels in the desired range.  You will need to match insulin dosing with exercise and healthy food choices. The treatment goal is to maintain the before-meal (preprandial), bedtime, and overnight blood glucose level at 60-99 mg/dL during pregnancy. The treatment goal is to further maintain peak after-meal blood sugar (postprandial glucose) level at 100-140 mg/dL. HOME CARE INSTRUCTIONS   Have your hemoglobin A1c level checked twice a year.  Perform daily blood glucose monitoring as directed by your health care provider. It is common to perform frequent blood glucose monitoring.  Monitor urine ketones when you are ill and as directed by your health care provider.  Take your diabetes medicine and insulin as directed by your health care provider   to maintain your blood glucose level in the desired range.  Never run out of diabetes medicine or insulin. It is needed every day.  Adjust insulin based on your intake of carbohydrates. Carbohydrates can raise blood glucose levels but  need to be included in your diet. Carbohydrates provide vitamins, minerals, and fiber which are an essential part of a healthy diet. Carbohydrates are found in fruits, vegetables, whole grains, dairy products, legumes, and foods containing added sugars.  Eat healthy foods. Alternate 3 meals with 3 snacks.  Maintain a healthy weight gain. The usual total expected weight gain varies according to your prepregnancy body mass index (BMI).  Carry a medical alert card or wear your medical alert jewelry.  Carry a 15-gram carbohydrate snack with you at all times to treat low blood glucose (hypoglycemia). Some examples of 15-gram carbohydrate snacks include:  Glucose tablets, 3 or 4.  Glucose gel, 15-gram tube.  Raisins, 2 tablespoons (24 g).  Jelly beans, 6.  Animal crackers, 8.  Fruit juice, regular soda, or low-fat milk, 4 ounces (120 mL).  Gummy treats, 9.  Recognize hypoglycemia. Hypoglycemia during pregnancy occurs with blood glucose levels of 60 mg/dL and below. The risk for hypoglycemia increases when fasting or skipping meals, during or after intense exercise, and during sleep. Hypoglycemia symptoms can include:  Tremors or shakes.  Decreased ability to concentrate.  Sweating.  Increased heart rate.  Headache.  Dry mouth.  Hunger.  Irritability.  Anxiety.  Restless sleep.  Altered speech or coordination.  Confusion.  Treat hypoglycemia promptly. If you are alert and able to safely swallow, follow the 15:15 rule:  Take 15-20 grams of rapid-acting glucose or carbohydrate. Rapid-acting options include glucose gel, glucose tablets, or 4 ounces (120 mL) of fruit juice, regular soda, or low-fat milk.  Check your blood glucose level 15 minutes after taking the glucose.  Take 15-20 grams more of glucose if the repeat blood glucose level is still 70 mg/dL or below.  Eat a meal or snack within 1 hour once blood glucose levels return to normal.  Be alert to polyuria  (excess urination) and polydipsia (excess thirst) which are early signs of hyperglycemia. An early awareness of hyperglycemia allows for prompt treatment. Treat hyperglycemia as directed by your health care provider.  Engage in at least 30 minutes of physical activity a day or as directed by your health care provider. Ten minutes of physical activity timed 30 minutes after each meal is encouraged to control postprandial blood glucose levels.  Adjust your insulin dosing and food intake as needed if you start a new exercise or sport.  Follow your sick-day plan at any time you are unable to eat or drink as usual.  Avoid tobacco and alcohol use.  Keep all follow-up visits as directed by your health care provider.  Follow the advice of your health care provider regarding your prenatal and post-delivery (postpartum) appointments, meal planning, exercise, medicines, vitamins, blood tests, other medical tests, and physical activities.  Perform daily skin and foot care. Examine your skin and feet daily for cuts, bruises, redness, nail problems, bleeding, blisters, or sores.  Brush your teeth and gums at least twice a day and floss at least once a day. Follow up with your dentist regularly.  Schedule an eye exam during the first trimester of your pregnancy or as directed by your health care provider.  Share your diabetes management plan with your workplace or school.  Stay up-to-date with immunizations.  Learn to manage stress.    Obtain ongoing diabetes education and support as needed.  Learn about and consider breastfeeding your baby.  You should have your blood sugar level checked 6-12 weeks after delivery. This is done with an oral glucose tolerance test (OGTT). SEEK MEDICAL CARE IF:   You are unable to eat food or drink fluids for more than 6 hours.  You have nausea and vomiting for more than 6 hours.  You have a blood glucose level of 200 mg/dL and you have ketones in your  urine.  There is a change in mental status.  You develop vision problems.  You have a persistent headache.  You have upper abdominal pain or discomfort.  You develop an additional serious illness.  You have diarrhea for more than 6 hours.  You have been sick or have had a fever for a couple of days and are not getting better. SEEK IMMEDIATE MEDICAL CARE IF:   You have difficulty breathing.  You no longer feel the baby moving.  You are bleeding or have discharge from your vagina.  You start having premature contractions or labor. MAKE SURE YOU:  Understand these instructions.  Will watch your condition.  Will get help right away if you are not doing well or get worse. Document Released: 06/10/2000 Document Revised: 07/19/2013 Document Reviewed: 10/01/2011 [employer] Patient Information 2015 [employer]. This information is not intended to replace advice given to you by your health care provider. Make sure you discuss any questions you have with your health care provider.  Third Trimester of Pregnancy The third trimester is from week 29 through week 42, months 7 through 9. The third trimester is a time when the fetus is growing rapidly. At the end of the ninth month, the fetus is about 20 inches in length and weighs 6-10 pounds.  BODY CHANGES Your body goes through many changes during pregnancy. The changes vary from woman to woman.   Your weight will continue to increase. You can expect to gain 25-35 pounds (11-16 kg) by the end of the pregnancy.  You may begin to get stretch marks on your hips, abdomen, and breasts.  You may urinate more often because the fetus is moving lower into your pelvis and pressing on your bladder.  You may develop or continue to have heartburn as a result of your pregnancy.  You may develop constipation because certain hormones are causing the muscles that push waste through your intestines to slow down.  You may develop hemorrhoids or  swollen, bulging veins (varicose veins).  You may have pelvic pain because of the weight gain and pregnancy hormones relaxing your joints between the bones in your pelvis. Backaches may result from overexertion of the muscles supporting your posture.  You may have changes in your hair. These can include thickening of your hair, rapid growth, and changes in texture. Some women also have hair loss during or after pregnancy, or hair that feels dry or thin. Your hair will most likely return to normal after your baby is born.  Your breasts will continue to grow and be tender. A yellow discharge may leak from your breasts called colostrum.  Your belly button may stick out.  You may feel short of breath because of your expanding uterus.  You may notice the fetus "dropping," or moving lower in your abdomen.  You may have a bloody mucus discharge. This usually occurs a few days to a week before labor begins.  Your cervix becomes thin and soft (effaced) near your due   date. WHAT TO EXPECT AT YOUR PRENATAL EXAMS  You will have prenatal exams every 2 weeks until week 36. Then, you will have weekly prenatal exams. During a routine prenatal visit:  You will be weighed to make sure you and the fetus are growing normally.  Your blood pressure is taken.  Your abdomen will be measured to track your baby's growth.  The fetal heartbeat will be listened to.  Any test results from the previous visit will be discussed.  You may have a cervical check near your due date to see if you have effaced. At around 36 weeks, your caregiver will check your cervix. At the same time, your caregiver will also perform a test on the secretions of the vaginal tissue. This test is to determine if a type of bacteria, Group B streptococcus, is present. Your caregiver will explain this further. Your caregiver may ask you:  What your birth plan is.  How you are feeling.  If you are feeling the baby move.  If you have had  any abnormal symptoms, such as leaking fluid, bleeding, severe headaches, or abdominal cramping.  If you have any questions. Other tests or screenings that may be performed during your third trimester include:  Blood tests that check for low iron levels (anemia).  Fetal testing to check the health, activity level, and growth of the fetus. Testing is done if you have certain medical conditions or if there are problems during the pregnancy. FALSE LABOR You may feel small, irregular contractions that eventually go away. These are called Braxton Hicks contractions, or false labor. Contractions may last for hours, days, or even weeks before true labor sets in. If contractions come at regular intervals, intensify, or become painful, it is best to be seen by your caregiver.  SIGNS OF LABOR   Menstrual-like cramps.  Contractions that are 5 minutes apart or less.  Contractions that start on the top of the uterus and spread down to the lower abdomen and back.  A sense of increased pelvic pressure or back pain.  A watery or bloody mucus discharge that comes from the vagina. If you have any of these signs before the 37th week of pregnancy, call your caregiver right away. You need to go to the hospital to get checked immediately. HOME CARE INSTRUCTIONS   Avoid all smoking, herbs, alcohol, and unprescribed drugs. These chemicals affect the formation and growth of the baby.  Follow your caregiver's instructions regarding medicine use. There are medicines that are either safe or unsafe to take during pregnancy.  Exercise only as directed by your caregiver. Experiencing uterine cramps is a good sign to stop exercising.  Continue to eat regular, healthy meals.  Wear a good support bra for breast tenderness.  Do not use hot tubs, steam rooms, or saunas.  Wear your seat belt at all times when driving.  Avoid raw meat, uncooked cheese, cat litter boxes, and soil used by cats. These carry germs that  can cause birth defects in the baby.  Take your prenatal vitamins.  Try taking a stool softener (if your caregiver approves) if you develop constipation. Eat more high-fiber foods, such as fresh vegetables or fruit and whole grains. Drink plenty of fluids to keep your urine clear or pale yellow.  Take warm sitz baths to soothe any pain or discomfort caused by hemorrhoids. Use hemorrhoid cream if your caregiver approves.  If you develop varicose veins, wear support hose. Elevate your feet for 15 minutes, 3-4 times a   day. Limit salt in your diet.  Avoid heavy lifting, wear low heal shoes, and practice good posture.  Rest a lot with your legs elevated if you have leg cramps or low back pain.  Visit your dentist if you have not gone during your pregnancy. Use a soft toothbrush to brush your teeth and be gentle when you floss.  A sexual relationship may be continued unless your caregiver directs you otherwise.  Do not travel far distances unless it is absolutely necessary and only with the approval of your caregiver.  Take prenatal classes to understand, practice, and ask questions about the labor and delivery.  Make a trial run to the hospital.  Pack your hospital bag.  Prepare the baby's nursery.  Continue to go to all your prenatal visits as directed by your caregiver. SEEK MEDICAL CARE IF:  You are unsure if you are in labor or if your water has broken.  You have dizziness.  You have mild pelvic cramps, pelvic pressure, or nagging pain in your abdominal area.  You have persistent nausea, vomiting, or diarrhea.  You have a bad smelling vaginal discharge.  You have pain with urination. SEEK IMMEDIATE MEDICAL CARE IF:   You have a fever.  You are leaking fluid from your vagina.  You have spotting or bleeding from your vagina.  You have severe abdominal cramping or pain.  You have rapid weight loss or gain.  You have shortness of breath with chest pain.  You notice  sudden or extreme swelling of your face, hands, ankles, feet, or legs.  You have not felt your baby move in over an hour.  You have severe headaches that do not go away with medicine.  You have vision changes. Document Released: 02/26/2001 Document Revised: 03/09/2013 Document Reviewed: 05/05/2012 [employer] Patient Information 2015 [employer]. This information is not intended to replace advice given to you by your health care provider. Make sure you discuss any questions you have with your health care provider.  Breastfeeding Deciding to breastfeed is one of the best choices you can make for you and your baby. A change in hormones during pregnancy causes your breast tissue to grow and increases the number and size of your milk ducts. These hormones also allow proteins, sugars, and fats from your blood supply to make breast milk in your milk-producing glands. Hormones prevent breast milk from being released before your baby is born as well as prompt milk flow after birth. Once breastfeeding has begun, thoughts of your baby, as well as his or her sucking or crying, can stimulate the release of milk from your milk-producing glands.  BENEFITS OF BREASTFEEDING For Your Baby  Your first milk (colostrum) helps your baby's digestive system function better.   There are antibodies in your milk that help your baby fight off infections.   Your baby has a lower incidence of asthma, allergies, and sudden infant death syndrome.   The nutrients in breast milk are better for your baby than infant formulas and are designed uniquely for your baby's needs.   Breast milk improves your baby's brain development.   Your baby is less likely to develop other conditions, such as childhood obesity, asthma, or type 2 diabetes mellitus.  For You   Breastfeeding helps to create a very special bond between you and your baby.   Breastfeeding is convenient. Breast milk is always available at the correct  temperature and costs nothing.   Breastfeeding helps to burn calories and helps you lose   the weight gained during pregnancy.   Breastfeeding makes your uterus contract to its prepregnancy size faster and slows bleeding (lochia) after you give birth.   Breastfeeding helps to lower your risk of developing type 2 diabetes mellitus, osteoporosis, and breast or ovarian cancer later in life. SIGNS THAT YOUR BABY IS HUNGRY Early Signs of Hunger  Increased alertness or activity.  Stretching.  Movement of the head from side to side.  Movement of the head and opening of the mouth when the corner of the mouth or cheek is stroked (rooting).  Increased sucking sounds, smacking lips, cooing, sighing, or squeaking.  Hand-to-mouth movements.  Increased sucking of fingers or hands. Late Signs of Hunger  Fussing.  Intermittent crying. Extreme Signs of Hunger Signs of extreme hunger will require calming and consoling before your baby will be able to breastfeed successfully. Do not wait for the following signs of extreme hunger to occur before you initiate breastfeeding:   Restlessness.  A loud, strong cry.   Screaming. BREASTFEEDING BASICS Breastfeeding Initiation  Find a comfortable place to sit or lie down, with your neck and back well supported.  Place a pillow or rolled up blanket under your baby to bring him or her to the level of your breast (if you are seated). Nursing pillows are specially designed to help support your arms and your baby while you breastfeed.  Make sure that your baby's abdomen is facing your abdomen.   Gently massage your breast. With your fingertips, massage from your chest wall toward your nipple in a circular motion. This encourages milk flow. You may need to continue this action during the feeding if your milk flows slowly.  Support your breast with 4 fingers underneath and your thumb above your nipple. Make sure your fingers are well away from your  nipple and your baby's mouth.   Stroke your baby's lips gently with your finger or nipple.   When your baby's mouth is open wide enough, quickly bring your baby to your breast, placing your entire nipple and as much of the colored area around your nipple (areola) as possible into your baby's mouth.   More areola should be visible above your baby's upper lip than below the lower lip.   Your baby's tongue should be between his or her lower gum and your breast.   Ensure that your baby's mouth is correctly positioned around your nipple (latched). Your baby's lips should create a seal on your breast and be turned out (everted).  It is common for your baby to suck about 2-3 minutes in order to start the flow of breast milk. Latching Teaching your baby how to latch on to your breast properly is very important. An improper latch can cause nipple pain and decreased milk supply for you and poor weight gain in your baby. Also, if your baby is not latched onto your nipple properly, he or she may swallow some air during feeding. This can make your baby fussy. Burping your baby when you switch breasts during the feeding can help to get rid of the air. However, teaching your baby to latch on properly is still the best way to prevent fussiness from swallowing air while breastfeeding. Signs that your baby has successfully latched on to your nipple:    Silent tugging or silent sucking, without causing you pain.   Swallowing heard between every 3-4 sucks.    Muscle movement above and in front of his or her ears while sucking.  Signs that your   baby has not successfully latched on to nipple:   Sucking sounds or smacking sounds from your baby while breastfeeding.  Nipple pain. If you think your baby has not latched on correctly, slip your finger into the corner of your baby's mouth to break the suction and place it between your baby's gums. Attempt breastfeeding initiation again. Signs of Successful  Breastfeeding Signs from your baby:   A gradual decrease in the number of sucks or complete cessation of sucking.   Falling asleep.   Relaxation of his or her body.   Retention of a small amount of milk in his or her mouth.   Letting go of your breast by himself or herself. Signs from you:  Breasts that have increased in firmness, weight, and size 1-3 hours after feeding.   Breasts that are softer immediately after breastfeeding.  Increased milk volume, as well as a change in milk consistency and color by the fifth day of breastfeeding.   Nipples that are not sore, cracked, or bleeding. Signs That Your Baby is Getting Enough Milk  Wetting at least 3 diapers in a 24-hour period. The urine should be clear and pale yellow by age 5 days.  At least 3 stools in a 24-hour period by age 5 days. The stool should be soft and yellow.  At least 3 stools in a 24-hour period by age 7 days. The stool should be seedy and yellow.  No loss of weight greater than 10% of birth weight during the first 3 days of age.  Average weight gain of 4-7 ounces (113-198 g) per week after age 4 days.  Consistent daily weight gain by age 5 days, without weight loss after the age of 2 weeks. After a feeding, your baby may spit up a small amount. This is common. BREASTFEEDING FREQUENCY AND DURATION Frequent feeding will help you make more milk and can prevent sore nipples and breast engorgement. Breastfeed when you feel the need to reduce the fullness of your breasts or when your baby shows signs of hunger. This is called "breastfeeding on demand." Avoid introducing a pacifier to your baby while you are working to establish breastfeeding (the first 4-6 weeks after your baby is born). After this time you may choose to use a pacifier. Research has shown that pacifier use during the first year of a baby's life decreases the risk of sudden infant death syndrome (SIDS). Allow your baby to feed on each breast as  long as he or she wants. Breastfeed until your baby is finished feeding. When your baby unlatches or falls asleep while feeding from the first breast, offer the second breast. Because newborns are often sleepy in the first few weeks of life, you may need to awaken your baby to get him or her to feed. Breastfeeding times will vary from baby to baby. However, the following rules can serve as a guide to help you ensure that your baby is properly fed:  Newborns (babies 4 weeks of age or younger) may breastfeed every 1-3 hours.  Newborns should not go longer than 3 hours during the day or 5 hours during the night without breastfeeding.  You should breastfeed your baby a minimum of 8 times in a 24-hour period until you begin to introduce solid foods to your baby at around 6 months of age. BREAST MILK PUMPING Pumping and storing breast milk allows you to ensure that your baby is exclusively fed your breast milk, even at times when you are unable to   breastfeed. This is especially important if you are going back to work while you are still breastfeeding or when you are not able to be present during feedings. Your lactation consultant can give you guidelines on how long it is safe to store breast milk.  A breast pump is a machine that allows you to pump milk from your breast into a sterile bottle. The pumped breast milk can then be stored in a refrigerator or freezer. Some breast pumps are operated by hand, while others use electricity. Ask your lactation consultant which type will work best for you. Breast pumps can be purchased, but some hospitals and breastfeeding support groups lease breast pumps on a monthly basis. A lactation consultant can teach you how to hand express breast milk, if you prefer not to use a pump.  CARING FOR YOUR BREASTS WHILE YOU BREASTFEED Nipples can become dry, cracked, and sore while breastfeeding. The following recommendations can help keep your breasts moisturized and  healthy:  Avoid using soap on your nipples.   Wear a supportive bra. Although not required, special nursing bras and tank tops are designed to allow access to your breasts for breastfeeding without taking off your entire bra or top. Avoid wearing underwire-style bras or extremely tight bras.  Air dry your nipples for 3-4minutes after each feeding.   Use only cotton bra pads to absorb leaked breast milk. Leaking of breast milk between feedings is normal.   Use lanolin on your nipples after breastfeeding. Lanolin helps to maintain your skin's normal moisture barrier. If you use pure lanolin, you do not need to wash it off before feeding your baby again. Pure lanolin is not toxic to your baby. You may also hand express a few drops of breast milk and gently massage that milk into your nipples and allow the milk to air dry. In the first few weeks after giving birth, some women experience extremely full breasts (engorgement). Engorgement can make your breasts feel heavy, warm, and tender to the touch. Engorgement peaks within 3-5 days after you give birth. The following recommendations can help ease engorgement:  Completely empty your breasts while breastfeeding or pumping. You may want to start by applying warm, moist heat (in the shower or with warm water-soaked hand towels) just before feeding or pumping. This increases circulation and helps the milk flow. If your baby does not completely empty your breasts while breastfeeding, pump any extra milk after he or she is finished.  Wear a snug bra (nursing or regular) or tank top for 1-2 days to signal your body to slightly decrease milk production.  Apply ice packs to your breasts, unless this is too uncomfortable for you.  Make sure that your baby is latched on and positioned properly while breastfeeding. If engorgement persists after 48 hours of following these recommendations, contact your health care provider or a lactation consultant. OVERALL  HEALTH CARE RECOMMENDATIONS WHILE BREASTFEEDING  Eat healthy foods. Alternate between meals and snacks, eating 3 of each per day. Because what you eat affects your breast milk, some of the foods may make your baby more irritable than usual. Avoid eating these foods if you are sure that they are negatively affecting your baby.  Drink milk, fruit juice, and water to satisfy your thirst (about 10 glasses a day).   Rest often, relax, and continue to take your prenatal vitamins to prevent fatigue, stress, and anemia.  Continue breast self-awareness checks.  Avoid chewing and smoking tobacco.  Avoid alcohol and drug use.   Some medicines that may be harmful to your baby can pass through breast milk. It is important to ask your health care provider before taking any medicine, including all over-the-counter and prescription medicine as well as vitamin and herbal supplements. It is possible to become pregnant while breastfeeding. If birth control is desired, ask your health care provider about options that will be safe for your baby. SEEK MEDICAL CARE IF:   You feel like you want to stop breastfeeding or have become frustrated with breastfeeding.  You have painful breasts or nipples.  Your nipples are cracked or bleeding.  Your breasts are red, tender, or warm.  You have a swollen area on either breast.  You have a fever or chills.  You have nausea or vomiting.  You have drainage other than breast milk from your nipples.  Your breasts do not become full before feedings by the fifth day after you give birth.  You feel sad and depressed.  Your baby is too sleepy to eat well.  Your baby is having trouble sleeping.   Your baby is wetting less than 3 diapers in a 24-hour period.  Your baby has less than 3 stools in a 24-hour period.  Your baby's skin or the white part of his or her eyes becomes yellow.   Your baby is not gaining weight by 5 days of age. SEEK IMMEDIATE MEDICAL CARE  IF:   Your baby is overly tired (lethargic) and does not want to wake up and feed.  Your baby develops an unexplained fever. Document Released: 03/04/2005 Document Revised: 03/09/2013 Document Reviewed: 08/26/2012 ExitCare Patient Information 2015 [employer]. This information is not intended to replace advice given to you by your health care provider. Make sure you discuss any questions you have with your health care provider.  

## 2014-06-02 ENCOUNTER — Ambulatory Visit (INDEPENDENT_AMBULATORY_CARE_PROVIDER_SITE_OTHER): Payer: Medicaid Other | Admitting: *Deleted

## 2014-06-02 VITALS — BP 112/69 | HR 78

## 2014-06-02 DIAGNOSIS — O24913 Unspecified diabetes mellitus in pregnancy, third trimester: Secondary | ICD-10-CM

## 2014-06-02 LAB — US OB FOLLOW UP

## 2014-06-02 NOTE — Progress Notes (Signed)
Interpreter - Katherine Jones present for encounter

## 2014-06-02 NOTE — Progress Notes (Signed)
NST reviewed and reactive.  

## 2014-06-06 ENCOUNTER — Ambulatory Visit (INDEPENDENT_AMBULATORY_CARE_PROVIDER_SITE_OTHER): Payer: Medicaid Other | Admitting: Obstetrics & Gynecology

## 2014-06-06 VITALS — BP 117/77 | HR 81

## 2014-06-06 DIAGNOSIS — O24419 Gestational diabetes mellitus in pregnancy, unspecified control: Secondary | ICD-10-CM

## 2014-06-06 DIAGNOSIS — O24913 Unspecified diabetes mellitus in pregnancy, third trimester: Secondary | ICD-10-CM

## 2014-06-06 LAB — POCT URINALYSIS DIP (DEVICE)
BILIRUBIN URINE: NEGATIVE
GLUCOSE, UA: NEGATIVE mg/dL
Hgb urine dipstick: NEGATIVE
KETONES UR: NEGATIVE mg/dL
Nitrite: NEGATIVE
PROTEIN: NEGATIVE mg/dL
SPECIFIC GRAVITY, URINE: 1.015 (ref 1.005–1.030)
Urobilinogen, UA: 0.2 mg/dL (ref 0.0–1.0)
pH: 8 (ref 5.0–8.0)

## 2014-06-06 MED ORDER — GLYBURIDE 2.5 MG PO TABS: 2.5000 mg | ORAL_TABLET | Freq: Every day | ORAL | Status: DC

## 2014-06-06 NOTE — Progress Notes (Signed)
[employer] interpreter # [phone].  Pt states she does not always take the glyburide in the morning. pt6 advised of importance of taking it after breakfast.  She voiced understanding.

## 2014-06-06 NOTE — Patient Instructions (Signed)
Gestational Diabetes Mellitus Gestational diabetes mellitus, often simply referred to as gestational diabetes, is a type of diabetes that some women develop during pregnancy. In gestational diabetes, the pancreas does not make enough insulin (a hormone), the cells are less responsive to the insulin that is made (insulin resistance), or both.Normally, insulin moves sugars from food into the tissue cells. The tissue cells use the sugars for energy. The lack of insulin or the lack of normal response to insulin causes excess sugars to build up in the blood instead of going into the tissue cells. As a result, high blood sugar (hyperglycemia) develops. The effect of high sugar (glucose) levels can cause many problems.  RISK FACTORS You have an increased chance of developing gestational diabetes if you have a family history of diabetes and also have one or more of the following risk factors:  A body mass index over 30 (obesity).  A previous pregnancy with gestational diabetes.  An older age at the time of pregnancy. If blood glucose levels are kept in the normal range during pregnancy, women can have a healthy pregnancy. If your blood glucose levels are not well controlled, there may be risks to you, your unborn baby (fetus), your labor and delivery, or your newborn baby.  SYMPTOMS  If symptoms are experienced, they are much like symptoms you would normally expect during pregnancy. The symptoms of gestational diabetes include:   Increased thirst (polydipsia).  Increased urination (polyuria).  Increased urination during the night (nocturia).  Weight loss. This weight loss may be rapid.  Frequent, recurring infections.  Tiredness (fatigue).  Weakness.  Vision changes, such as blurred vision.  Fruity smell to your breath.  Abdominal pain. DIAGNOSIS Diabetes is diagnosed when blood glucose levels are increased. Your blood glucose level may be checked by one or more of the following blood  tests:  A fasting blood glucose test. You will not be allowed to eat for at least 8 hours before a blood sample is taken.  A random blood glucose test. Your blood glucose is checked at any time of the day regardless of when you ate.  A hemoglobin A1c blood glucose test. A hemoglobin A1c test provides information about blood glucose control over the previous 3 months.  An oral glucose tolerance test (OGTT). Your blood glucose is measured after you have not eaten (fasted) for 1-3 hours and then after you drink a glucose-containing beverage. Since the hormones that cause insulin resistance are highest at about 24-28 weeks of a pregnancy, an OGTT is usually performed during that time. If you have risk factors for gestational diabetes, your health care provider may test you for gestational diabetes earlier than 24 weeks of pregnancy. TREATMENT   You will need to take diabetes medicine or insulin daily to keep blood glucose levels in the desired range.  You will need to match insulin dosing with exercise and healthy food choices. The treatment goal is to maintain the before-meal (preprandial), bedtime, and overnight blood glucose level at 60-99 mg/dL during pregnancy. The treatment goal is to further maintain peak after-meal blood sugar (postprandial glucose) level at 100-140 mg/dL. HOME CARE INSTRUCTIONS   Have your hemoglobin A1c level checked twice a year.  Perform daily blood glucose monitoring as directed by your health care provider. It is common to perform frequent blood glucose monitoring.  Monitor urine ketones when you are ill and as directed by your health care provider.  Take your diabetes medicine and insulin as directed by your health care provider   to maintain your blood glucose level in the desired range.  Never run out of diabetes medicine or insulin. It is needed every day.  Adjust insulin based on your intake of carbohydrates. Carbohydrates can raise blood glucose levels but  need to be included in your diet. Carbohydrates provide vitamins, minerals, and fiber which are an essential part of a healthy diet. Carbohydrates are found in fruits, vegetables, whole grains, dairy products, legumes, and foods containing added sugars.  Eat healthy foods. Alternate 3 meals with 3 snacks.  Maintain a healthy weight gain. The usual total expected weight gain varies according to your prepregnancy body mass index (BMI).  Carry a medical alert card or wear your medical alert jewelry.  Carry a 15-gram carbohydrate snack with you at all times to treat low blood glucose (hypoglycemia). Some examples of 15-gram carbohydrate snacks include:  Glucose tablets, 3 or 4.  Glucose gel, 15-gram tube.  Raisins, 2 tablespoons (24 g).  Jelly beans, 6.  Animal crackers, 8.  Fruit juice, regular soda, or low-fat milk, 4 ounces (120 mL).  Gummy treats, 9.  Recognize hypoglycemia. Hypoglycemia during pregnancy occurs with blood glucose levels of 60 mg/dL and below. The risk for hypoglycemia increases when fasting or skipping meals, during or after intense exercise, and during sleep. Hypoglycemia symptoms can include:  Tremors or shakes.  Decreased ability to concentrate.  Sweating.  Increased heart rate.  Headache.  Dry mouth.  Hunger.  Irritability.  Anxiety.  Restless sleep.  Altered speech or coordination.  Confusion.  Treat hypoglycemia promptly. If you are alert and able to safely swallow, follow the 15:15 rule:  Take 15-20 grams of rapid-acting glucose or carbohydrate. Rapid-acting options include glucose gel, glucose tablets, or 4 ounces (120 mL) of fruit juice, regular soda, or low-fat milk.  Check your blood glucose level 15 minutes after taking the glucose.  Take 15-20 grams more of glucose if the repeat blood glucose level is still 70 mg/dL or below.  Eat a meal or snack within 1 hour once blood glucose levels return to normal.  Be alert to polyuria  (excess urination) and polydipsia (excess thirst) which are early signs of hyperglycemia. An early awareness of hyperglycemia allows for prompt treatment. Treat hyperglycemia as directed by your health care provider.  Engage in at least 30 minutes of physical activity a day or as directed by your health care provider. Ten minutes of physical activity timed 30 minutes after each meal is encouraged to control postprandial blood glucose levels.  Adjust your insulin dosing and food intake as needed if you start a new exercise or sport.  Follow your sick-day plan at any time you are unable to eat or drink as usual.  Avoid tobacco and alcohol use.  Keep all follow-up visits as directed by your health care provider.  Follow the advice of your health care provider regarding your prenatal and post-delivery (postpartum) appointments, meal planning, exercise, medicines, vitamins, blood tests, other medical tests, and physical activities.  Perform daily skin and foot care. Examine your skin and feet daily for cuts, bruises, redness, nail problems, bleeding, blisters, or sores.  Brush your teeth and gums at least twice a day and floss at least once a day. Follow up with your dentist regularly.  Schedule an eye exam during the first trimester of your pregnancy or as directed by your health care provider.  Share your diabetes management plan with your workplace or school.  Stay up-to-date with immunizations.  Learn to manage stress.    Obtain ongoing diabetes education and support as needed.  Learn about and consider breastfeeding your baby.  You should have your blood sugar level checked 6-12 weeks after delivery. This is done with an oral glucose tolerance test (OGTT). SEEK MEDICAL CARE IF:   You are unable to eat food or drink fluids for more than 6 hours.  You have nausea and vomiting for more than 6 hours.  You have a blood glucose level of 200 mg/dL and you have ketones in your  urine.  There is a change in mental status.  You develop vision problems.  You have a persistent headache.  You have upper abdominal pain or discomfort.  You develop an additional serious illness.  You have diarrhea for more than 6 hours.  You have been sick or have had a fever for a couple of days and are not getting better. SEEK IMMEDIATE MEDICAL CARE IF:   You have difficulty breathing.  You no longer feel the baby moving.  You are bleeding or have discharge from your vagina.  You start having premature contractions or labor. MAKE SURE YOU:  Understand these instructions.  Will watch your condition.  Will get help right away if you are not doing well or get worse. Document Released: 06/10/2000 Document Revised: 07/19/2013 Document Reviewed: 10/01/2011 ExitCare Patient Information 2015 [employer]. This information is not intended to replace advice given to you by your health care provider. Make sure you discuss any questions you have with your health care provider.  

## 2014-06-06 NOTE — Progress Notes (Signed)
FBS 75-84, pp 76-145, urged compliance with glyburide

## 2014-06-09 ENCOUNTER — Ambulatory Visit (INDEPENDENT_AMBULATORY_CARE_PROVIDER_SITE_OTHER): Payer: Medicaid Other | Admitting: *Deleted

## 2014-06-09 VITALS — BP 115/73 | HR 88

## 2014-06-09 DIAGNOSIS — O24913 Unspecified diabetes mellitus in pregnancy, third trimester: Secondary | ICD-10-CM

## 2014-06-09 LAB — US OB FOLLOW UP

## 2014-06-09 NOTE — Progress Notes (Addendum)
[employer] Interpreter # [phone] Katherine Jones([name]) used for encounter today.  Pt informed of test results today and next appts.  She voiced understanding. She desires to continue to use [name] instead of in person Burmese interpreter because she does not understand that language as well.

## 2014-06-10 NOTE — Progress Notes (Signed)
NST reviewed and reactive.  

## 2014-06-13 ENCOUNTER — Ambulatory Visit (INDEPENDENT_AMBULATORY_CARE_PROVIDER_SITE_OTHER): Payer: Medicaid Other | Admitting: Family Medicine

## 2014-06-13 VITALS — BP 105/60 | HR 91 | Temp 98.2°F | Wt 115.6 lb

## 2014-06-13 DIAGNOSIS — O24419 Gestational diabetes mellitus in pregnancy, unspecified control: Secondary | ICD-10-CM

## 2014-06-13 DIAGNOSIS — O24913 Unspecified diabetes mellitus in pregnancy, third trimester: Secondary | ICD-10-CM

## 2014-06-13 MED ORDER — GLYBURIDE 2.5 MG PO TABS: 3.7500 mg | ORAL_TABLET | Freq: Every day | ORAL | Status: DC

## 2014-06-13 NOTE — Patient Instructions (Signed)
Third Trimester of Pregnancy The third trimester is from week 29 through week 42, months 7 through 9. The third trimester is a time when the fetus is growing rapidly. At the end of the ninth month, the fetus is about 20 inches in length and weighs 6-10 pounds.  BODY CHANGES Your body goes through many changes during pregnancy. The changes vary from woman to woman.   Your weight will continue to increase. You can expect to gain 25-35 pounds (11-16 kg) by the end of the pregnancy.  You may begin to get stretch marks on your hips, abdomen, and breasts.  You may urinate more often because the fetus is moving lower into your pelvis and pressing on your bladder.  You may develop or continue to have heartburn as a result of your pregnancy.  You may develop constipation because certain hormones are causing the muscles that push waste through your intestines to slow down.  You may develop hemorrhoids or swollen, bulging veins (varicose veins).  You may have pelvic pain because of the weight gain and pregnancy hormones relaxing your joints between the bones in your pelvis. Backaches may result from overexertion of the muscles supporting your posture.  You may have changes in your hair. These can include thickening of your hair, rapid growth, and changes in texture. Some women also have hair loss during or after pregnancy, or hair that feels dry or thin. Your hair will most likely return to normal after your baby is born.  Your breasts will continue to grow and be tender. A yellow discharge may leak from your breasts called colostrum.  Your belly button may stick out.  You may feel short of breath because of your expanding uterus.  You may notice the fetus "dropping," or moving lower in your abdomen.  You may have a bloody mucus discharge. This usually occurs a few days to a week before labor begins.  Your cervix becomes thin and soft (effaced) near your due date. WHAT TO EXPECT AT YOUR PRENATAL  EXAMS  You will have prenatal exams every 2 weeks until week 36. Then, you will have weekly prenatal exams. During a routine prenatal visit:  You will be weighed to make sure you and the fetus are growing normally.  Your blood pressure is taken.  Your abdomen will be measured to track your baby's growth.  The fetal heartbeat will be listened to.  Any test results from the previous visit will be discussed.  You may have a cervical check near your due date to see if you have effaced. At around 36 weeks, your caregiver will check your cervix. At the same time, your caregiver will also perform a test on the secretions of the vaginal tissue. This test is to determine if a type of bacteria, Group B streptococcus, is present. Your caregiver will explain this further. Your caregiver may ask you:  What your birth plan is.  How you are feeling.  If you are feeling the baby move.  If you have had any abnormal symptoms, such as leaking fluid, bleeding, severe headaches, or abdominal cramping.  If you have any questions. Other tests or screenings that may be performed during your third trimester include:  Blood tests that check for low iron levels (anemia).  Fetal testing to check the health, activity level, and growth of the fetus. Testing is done if you have certain medical conditions or if there are problems during the pregnancy. FALSE LABOR You may feel small, irregular contractions that   eventually go away. These are called Braxton Hicks contractions, or false labor. Contractions may last for hours, days, or even weeks before true labor sets in. If contractions come at regular intervals, intensify, or become painful, it is best to be seen by your caregiver.  SIGNS OF LABOR   Menstrual-like cramps.  Contractions that are 5 minutes apart or less.  Contractions that start on the top of the uterus and spread down to the lower abdomen and back.  A sense of increased pelvic pressure or back  pain.  A watery or bloody mucus discharge that comes from the vagina. If you have any of these signs before the 37th week of pregnancy, call your caregiver right away. You need to go to the hospital to get checked immediately. HOME CARE INSTRUCTIONS   Avoid all smoking, herbs, alcohol, and unprescribed drugs. These chemicals affect the formation and growth of the baby.  Follow your caregiver's instructions regarding medicine use. There are medicines that are either safe or unsafe to take during pregnancy.  Exercise only as directed by your caregiver. Experiencing uterine cramps is a good sign to stop exercising.  Continue to eat regular, healthy meals.  Wear a good support bra for breast tenderness.  Do not use hot tubs, steam rooms, or saunas.  Wear your seat belt at all times when driving.  Avoid raw meat, uncooked cheese, cat litter boxes, and soil used by cats. These carry germs that can cause birth defects in the baby.  Take your prenatal vitamins.  Try taking a stool softener (if your caregiver approves) if you develop constipation. Eat more high-fiber foods, such as fresh vegetables or fruit and whole grains. Drink plenty of fluids to keep your urine clear or pale yellow.  Take warm sitz baths to soothe any pain or discomfort caused by hemorrhoids. Use hemorrhoid cream if your caregiver approves.  If you develop varicose veins, wear support hose. Elevate your feet for 15 minutes, 3-4 times a day. Limit salt in your diet.  Avoid heavy lifting, wear low heal shoes, and practice good posture.  Rest a lot with your legs elevated if you have leg cramps or low back pain.  Visit your dentist if you have not gone during your pregnancy. Use a soft toothbrush to brush your teeth and be gentle when you floss.  A sexual relationship may be continued unless your caregiver directs you otherwise.  Do not travel far distances unless it is absolutely necessary and only with the approval  of your caregiver.  Take prenatal classes to understand, practice, and ask questions about the labor and delivery.  Make a trial run to the hospital.  Pack your hospital bag.  Prepare the baby's nursery.  Continue to go to all your prenatal visits as directed by your caregiver. SEEK MEDICAL CARE IF:  You are unsure if you are in labor or if your water has broken.  You have dizziness.  You have mild pelvic cramps, pelvic pressure, or nagging pain in your abdominal area.  You have persistent nausea, vomiting, or diarrhea.  You have a bad smelling vaginal discharge.  You have pain with urination. SEEK IMMEDIATE MEDICAL CARE IF:   You have a fever.  You are leaking fluid from your vagina.  You have spotting or bleeding from your vagina.  You have severe abdominal cramping or pain.  You have rapid weight loss or gain.  You have shortness of breath with chest pain.  You notice sudden or extreme swelling   of your face, hands, ankles, feet, or legs.  You have not felt your baby move in over an hour.  You have severe headaches that do not go away with medicine.  You have vision changes. Document Released: 02/26/2001 Document Revised: 03/09/2013 Document Reviewed: 05/05/2012 Exit[employer] Patient Information 2015 [employer]. This information is not intended to replace advice given to you by your health care provider. Make sure you discuss any questions you have with your health care provider.  

## 2014-06-13 NOTE — Progress Notes (Signed)
[employer] interpreter # [phone] (Burmese) used for visit today. Pt has no c/o or problems.

## 2014-06-13 NOTE — Progress Notes (Signed)
Fasting Controlled 2hr PP 91-141 (9 of 21 > 120).  Increase glyburide to 3.75mg  with breakfast NST reactive.

## 2014-06-13 NOTE — Addendum Note (Signed)
Addended by: Louanna RawAMPBELL, Daekwon Beswick M on: 06/13/2014 12:54 PM   Modules accepted: Orders, Medications

## 2014-06-16 ENCOUNTER — Ambulatory Visit (INDEPENDENT_AMBULATORY_CARE_PROVIDER_SITE_OTHER): Payer: Medicaid Other | Admitting: *Deleted

## 2014-06-16 VITALS — BP 111/68 | HR 102

## 2014-06-16 DIAGNOSIS — O24913 Unspecified diabetes mellitus in pregnancy, third trimester: Secondary | ICD-10-CM

## 2014-06-16 LAB — US OB FOLLOW UP

## 2014-06-16 NOTE — Progress Notes (Signed)
NST reviewed and reactive.  Leidi Astle L. Harraway-Smith, M.D., FACOG    

## 2014-06-16 NOTE — Progress Notes (Signed)
[employer] interpreter # [id number].

## 2014-06-20 ENCOUNTER — Ambulatory Visit (INDEPENDENT_AMBULATORY_CARE_PROVIDER_SITE_OTHER): Payer: Medicaid Other | Admitting: Obstetrics & Gynecology

## 2014-06-20 VITALS — BP 118/68 | HR 88 | Temp 97.8°F | Wt 116.0 lb

## 2014-06-20 DIAGNOSIS — O24913 Unspecified diabetes mellitus in pregnancy, third trimester: Secondary | ICD-10-CM

## 2014-06-20 DIAGNOSIS — Z789 Other specified health status: Secondary | ICD-10-CM

## 2014-06-20 LAB — POCT URINALYSIS DIP (DEVICE)
BILIRUBIN URINE: NEGATIVE
GLUCOSE, UA: NEGATIVE mg/dL
HGB URINE DIPSTICK: NEGATIVE
Ketones, ur: NEGATIVE mg/dL
Nitrite: NEGATIVE
PH: 8.5 — AB (ref 5.0–8.0)
Protein, ur: NEGATIVE mg/dL
Specific Gravity, Urine: 1.02 (ref 1.005–1.030)
Urobilinogen, UA: 0.2 mg/dL (ref 0.0–1.0)

## 2014-06-20 MED ORDER — ACCU-CHEK FASTCLIX LANCETS MISC: 1.0000 [IU] | Freq: Four times a day (QID) | Status: DC

## 2014-06-20 NOTE — Addendum Note (Signed)
Addended by: Kathee DeltonHILLMAN, [place] Dimmitt L on: 06/20/2014 11:59 AM   Modules accepted: Orders

## 2014-06-20 NOTE — Progress Notes (Signed)
[employer] interpreter used, [facility] Blood sugars reviewed , just a couple of breakfast postprandials. Normal lunch and dinner postprandials.  Patient reports that she does not take any glyburide; has not picked up any prescription!  Basically, diet controlled. Will continue antenetal testing given A2/B status. NST performed today was reviewed and was found to be reactive.  Continue recommended antenatal testing and prenatal care. No other complaints or concerns.  Labor and fetal movement precautions reviewed.  Pelvic cultures next visit.

## 2014-06-20 NOTE — Patient Instructions (Signed)
Return to clinic for any obstetric concerns or go to [facility] for evaluation  

## 2014-06-20 NOTE — Progress Notes (Signed)
Used [employer] Interpreter @[id number] . States has not gotten the glyburide medicine yet - has not taken glyburide at all during pregnancy!

## 2014-06-20 NOTE — Progress Notes (Signed)
Small Leukocytes in urine  

## 2014-06-24 ENCOUNTER — Ambulatory Visit (INDEPENDENT_AMBULATORY_CARE_PROVIDER_SITE_OTHER): Payer: Medicaid Other | Admitting: *Deleted

## 2014-06-24 VITALS — BP 105/67 | HR 95

## 2014-06-24 DIAGNOSIS — O24913 Unspecified diabetes mellitus in pregnancy, third trimester: Secondary | ICD-10-CM | POA: Diagnosis not present

## 2014-06-24 NOTE — Progress Notes (Signed)
NST reviewed and reactive, one decel noted with prolonged contraction, reassuring post this.

## 2014-06-24 NOTE — Progress Notes (Signed)
[id number] 

## 2014-06-27 ENCOUNTER — Ambulatory Visit (INDEPENDENT_AMBULATORY_CARE_PROVIDER_SITE_OTHER): Payer: Medicaid Other | Admitting: Obstetrics & Gynecology

## 2014-06-27 VITALS — BP 123/70 | HR 96 | Temp 98.2°F | Wt 119.0 lb

## 2014-06-27 DIAGNOSIS — O24913 Unspecified diabetes mellitus in pregnancy, third trimester: Secondary | ICD-10-CM | POA: Diagnosis present

## 2014-06-27 DIAGNOSIS — Z789 Other specified health status: Secondary | ICD-10-CM

## 2014-06-27 DIAGNOSIS — O0993 Supervision of high risk pregnancy, unspecified, third trimester: Secondary | ICD-10-CM

## 2014-06-27 LAB — OB RESULTS CONSOLE GC/CHLAMYDIA
CHLAMYDIA, DNA PROBE: NEGATIVE
Gonorrhea: NEGATIVE

## 2014-06-27 LAB — POCT URINALYSIS DIP (DEVICE)
Bilirubin Urine: NEGATIVE
GLUCOSE, UA: NEGATIVE mg/dL
Hgb urine dipstick: NEGATIVE
Ketones, ur: NEGATIVE mg/dL
NITRITE: NEGATIVE
PROTEIN: 30 mg/dL — AB
SPECIFIC GRAVITY, URINE: 1.02 (ref 1.005–1.030)
UROBILINOGEN UA: 0.2 mg/dL (ref 0.0–1.0)
pH: 7 (ref 5.0–8.0)

## 2014-06-27 LAB — OB RESULTS CONSOLE GBS: STREP GROUP B AG: NEGATIVE

## 2014-06-27 NOTE — Progress Notes (Signed)
[employer] interpreter [id number] used for this encounter.

## 2014-06-27 NOTE — Patient Instructions (Signed)
Return to clinic for any obstetric concerns or go to [facility] for evaluation  

## 2014-06-27 NOTE — Progress Notes (Signed)
[employer] interpreter [phone]#[id number] used for interpretation Blood sugars reviewed, just a couple of elevated postprandials. Continue diet control.  Pelvic cultures performed today. NST performed today was reviewed and was found to be reactive. Continue recommended antenatal testing and prenatal care given A2/B status. No other complaints or concerns. Labor and fetal movement precautions reviewed.

## 2014-06-28 LAB — GC/CHLAMYDIA PROBE AMP
CT PROBE, AMP APTIMA: NEGATIVE
GC PROBE AMP APTIMA: NEGATIVE

## 2014-06-29 ENCOUNTER — Ambulatory Visit (HOSPITAL_COMMUNITY): Payer: Medicaid Other

## 2014-06-29 LAB — CULTURE, BETA STREP (GROUP B ONLY)

## 2014-06-30 ENCOUNTER — Ambulatory Visit (HOSPITAL_COMMUNITY)
Admission: RE | Admit: 2014-06-30 | Discharge: 2014-06-30 | Disposition: A | Discharge status: Home / Self Care | Payer: Medicaid Other | Source: Ambulatory Visit | Attending: Maternal and Fetal Medicine | Admitting: Maternal and Fetal Medicine

## 2014-06-30 ENCOUNTER — Encounter (HOSPITAL_COMMUNITY): Payer: Self-pay

## 2014-06-30 ENCOUNTER — Ambulatory Visit (HOSPITAL_COMMUNITY)
Admission: RE | Admit: 2014-06-30 | Discharge: 2014-06-30 | Disposition: A | Discharge status: Home / Self Care | Payer: Medicaid Other | Source: Ambulatory Visit | Attending: Obstetrics & Gynecology | Admitting: Obstetrics & Gynecology

## 2014-06-30 VITALS — BP 121/63 | HR 89

## 2014-06-30 DIAGNOSIS — O24913 Unspecified diabetes mellitus in pregnancy, third trimester: Secondary | ICD-10-CM | POA: Diagnosis not present

## 2014-06-30 DIAGNOSIS — O24419 Gestational diabetes mellitus in pregnancy, unspecified control: Secondary | ICD-10-CM

## 2014-06-30 DIAGNOSIS — O0993 Supervision of high risk pregnancy, unspecified, third trimester: Secondary | ICD-10-CM

## 2014-06-30 DIAGNOSIS — Z3A36 36 weeks gestation of pregnancy: Secondary | ICD-10-CM | POA: Insufficient documentation

## 2014-06-30 NOTE — ED Notes (Signed)
[employer] Interpreter [phone] used for [employer].

## 2014-07-03 ENCOUNTER — Inpatient Hospital Stay (HOSPITAL_COMMUNITY): Payer: Medicaid Other

## 2014-07-03 ENCOUNTER — Encounter (HOSPITAL_COMMUNITY): Payer: Self-pay

## 2014-07-03 ENCOUNTER — Inpatient Hospital Stay (HOSPITAL_COMMUNITY)
Admission: AD | Admit: 2014-07-03 | Discharge: 2014-07-03 | Disposition: A | Discharge status: Home / Self Care | Payer: Medicaid Other | Source: Ambulatory Visit | Attending: Obstetrics and Gynecology | Admitting: Obstetrics and Gynecology

## 2014-07-03 DIAGNOSIS — O24919 Unspecified diabetes mellitus in pregnancy, unspecified trimester: Secondary | ICD-10-CM | POA: Insufficient documentation

## 2014-07-03 DIAGNOSIS — R109 Unspecified abdominal pain: Secondary | ICD-10-CM | POA: Insufficient documentation

## 2014-07-03 DIAGNOSIS — O9989 Other specified diseases and conditions complicating pregnancy, childbirth and the puerperium: Secondary | ICD-10-CM | POA: Insufficient documentation

## 2014-07-03 DIAGNOSIS — IMO0002 Reserved for concepts with insufficient information to code with codable children: Secondary | ICD-10-CM | POA: Insufficient documentation

## 2014-07-03 DIAGNOSIS — Z3A37 37 weeks gestation of pregnancy: Secondary | ICD-10-CM | POA: Diagnosis not present

## 2014-07-03 HISTORY — DX: Gestational diabetes mellitus in pregnancy, unspecified control: O24.419

## 2014-07-03 NOTE — Progress Notes (Signed)
NOtified of pt arrival in [facility] and complaint as well as difficulty with communicating due to language barrier and unavailability of interpreter.

## 2014-07-03 NOTE — Progress Notes (Signed)
Notified of preliminary result of 8/8 on BPP and unchanged cervix. Will discharge home

## 2014-07-03 NOTE — [facility] Note (Signed)
[employer] interpreter used to assess pt. Per pac[facility] interpreter operator, there are no [place] interpreters which is the patient's main language. Patient states burmese is next best language. Will attempt to assess with burmese

## 2014-07-03 NOTE — MAU Note (Signed)
Pt presents complaining of "stomach pains" all day that are not getting better. Denies vaginal bleeding or discharge. Reports good fetal movement.

## 2014-07-03 NOTE — Discharge Instructions (Signed)
Third Trimester of Pregnancy The third trimester is from week 29 through week 42, months 7 through 9. The third trimester is a time when the fetus is growing rapidly. At the end of the ninth month, the fetus is about 20 inches in length and weighs 6-10 pounds.  BODY CHANGES Your body goes through many changes during pregnancy. The changes vary from woman to woman.   Your weight will continue to increase. You can expect to gain 25-35 pounds (11-16 kg) by the end of the pregnancy.  You may begin to get stretch marks on your hips, abdomen, and breasts.  You may urinate more often because the fetus is moving lower into your pelvis and pressing on your bladder.  You may develop or continue to have heartburn as a result of your pregnancy.  You may develop constipation because certain hormones are causing the muscles that push waste through your intestines to slow down.  You may develop hemorrhoids or swollen, bulging veins (varicose veins).  You may have pelvic pain because of the weight gain and pregnancy hormones relaxing your joints between the bones in your pelvis. Backaches may result from overexertion of the muscles supporting your posture.  You may have changes in your hair. These can include thickening of your hair, rapid growth, and changes in texture. Some women also have hair loss during or after pregnancy, or hair that feels dry or thin. Your hair will most likely return to normal after your baby is born.  Your breasts will continue to grow and be tender. A yellow discharge may leak from your breasts called colostrum.  Your belly button may stick out.  You may feel short of breath because of your expanding uterus.  You may notice the fetus "dropping," or moving lower in your abdomen.  You may have a bloody mucus discharge. This usually occurs a few days to a week before labor begins.  Your cervix becomes thin and soft (effaced) near your due date. WHAT TO EXPECT AT YOUR PRENATAL  EXAMS  You will have prenatal exams every 2 weeks until week 36. Then, you will have weekly prenatal exams. During a routine prenatal visit:  You will be weighed to make sure you and the fetus are growing normally.  Your blood pressure is taken.  Your abdomen will be measured to track your baby's growth.  The fetal heartbeat will be listened to.  Any test results from the previous visit will be discussed.  You may have a cervical check near your due date to see if you have effaced. At around 36 weeks, your caregiver will check your cervix. At the same time, your caregiver will also perform a test on the secretions of the vaginal tissue. This test is to determine if a type of bacteria, Group B streptococcus, is present. Your caregiver will explain this further. Your caregiver may ask you:  What your birth plan is.  How you are feeling.  If you are feeling the baby move.  If you have had any abnormal symptoms, such as leaking fluid, bleeding, severe headaches, or abdominal cramping.  If you have any questions. Other tests or screenings that may be performed during your third trimester include:  Blood tests that check for low iron levels (anemia).  Fetal testing to check the health, activity level, and growth of the fetus. Testing is done if you have certain medical conditions or if there are problems during the pregnancy. FALSE LABOR You may feel small, irregular contractions that   eventually go away. These are called Braxton Hicks contractions, or false labor. Contractions may last for hours, days, or even weeks before true labor sets in. If contractions come at regular intervals, intensify, or become painful, it is best to be seen by your caregiver.  SIGNS OF LABOR   Menstrual-like cramps.  Contractions that are 5 minutes apart or less.  Contractions that start on the top of the uterus and spread down to the lower abdomen and back.  A sense of increased pelvic pressure or back  pain.  A watery or bloody mucus discharge that comes from the vagina. If you have any of these signs before the 37th week of pregnancy, call your caregiver right away. You need to go to the hospital to get checked immediately. HOME CARE INSTRUCTIONS   Avoid all smoking, herbs, alcohol, and unprescribed drugs. These chemicals affect the formation and growth of the baby.  Follow your caregiver's instructions regarding medicine use. There are medicines that are either safe or unsafe to take during pregnancy.  Exercise only as directed by your caregiver. Experiencing uterine cramps is a good sign to stop exercising.  Continue to eat regular, healthy meals.  Wear a good support bra for breast tenderness.  Do not use hot tubs, steam rooms, or saunas.  Wear your seat belt at all times when driving.  Avoid raw meat, uncooked cheese, cat litter boxes, and soil used by cats. These carry germs that can cause birth defects in the baby.  Take your prenatal vitamins.  Try taking a stool softener (if your caregiver approves) if you develop constipation. Eat more high-fiber foods, such as fresh vegetables or fruit and whole grains. Drink plenty of fluids to keep your urine clear or pale yellow.  Take warm sitz baths to soothe any pain or discomfort caused by hemorrhoids. Use hemorrhoid cream if your caregiver approves.  If you develop varicose veins, wear support hose. Elevate your feet for 15 minutes, 3-4 times a day. Limit salt in your diet.  Avoid heavy lifting, wear low heal shoes, and practice good posture.  Rest a lot with your legs elevated if you have leg cramps or low back pain.  Visit your dentist if you have not gone during your pregnancy. Use a soft toothbrush to brush your teeth and be gentle when you floss.  A sexual relationship may be continued unless your caregiver directs you otherwise.  Do not travel far distances unless it is absolutely necessary and only with the approval  of your caregiver.  Take prenatal classes to understand, practice, and ask questions about the labor and delivery.  Make a trial run to the hospital.  Pack your hospital bag.  Prepare the baby's nursery.  Continue to go to all your prenatal visits as directed by your caregiver. SEEK MEDICAL CARE IF:  You are unsure if you are in labor or if your water has broken.  You have dizziness.  You have mild pelvic cramps, pelvic pressure, or nagging pain in your abdominal area.  You have persistent nausea, vomiting, or diarrhea.  You have a bad smelling vaginal discharge.  You have pain with urination. SEEK IMMEDIATE MEDICAL CARE IF:   You have a fever.  You are leaking fluid from your vagina.  You have spotting or bleeding from your vagina.  You have severe abdominal cramping or pain.  You have rapid weight loss or gain.  You have shortness of breath with chest pain.  You notice sudden or extreme swelling   of your face, hands, ankles, feet, or legs.  You have not felt your baby move in over an hour.  You have severe headaches that do not go away with medicine.  You have vision changes. Document Released: 02/26/2001 Document Revised: 03/09/2013 Document Reviewed: 05/05/2012 ExitCare Patient Information 2015 [employer]. This information is not intended to replace advice given to you by your health care provider. Make sure you discuss any questions you have with your health care provider.  

## 2014-07-04 ENCOUNTER — Ambulatory Visit (INDEPENDENT_AMBULATORY_CARE_PROVIDER_SITE_OTHER): Payer: Medicaid Other | Admitting: Obstetrics and Gynecology

## 2014-07-04 VITALS — BP 108/66 | HR 83 | Temp 97.8°F | Wt 120.0 lb

## 2014-07-04 DIAGNOSIS — O0993 Supervision of high risk pregnancy, unspecified, third trimester: Secondary | ICD-10-CM

## 2014-07-04 DIAGNOSIS — IMO0002 Reserved for concepts with insufficient information to code with codable children: Secondary | ICD-10-CM | POA: Insufficient documentation

## 2014-07-04 DIAGNOSIS — O24913 Unspecified diabetes mellitus in pregnancy, third trimester: Secondary | ICD-10-CM

## 2014-07-04 DIAGNOSIS — Z3A37 37 weeks gestation of pregnancy: Secondary | ICD-10-CM | POA: Insufficient documentation

## 2014-07-04 DIAGNOSIS — O24919 Unspecified diabetes mellitus in pregnancy, unspecified trimester: Secondary | ICD-10-CM | POA: Insufficient documentation

## 2014-07-04 LAB — POCT URINALYSIS DIP (DEVICE)
Bilirubin Urine: NEGATIVE
GLUCOSE, UA: NEGATIVE mg/dL
Hgb urine dipstick: NEGATIVE
Ketones, ur: NEGATIVE mg/dL
NITRITE: NEGATIVE
Protein, ur: NEGATIVE mg/dL
Specific Gravity, Urine: 1.02 (ref 1.005–1.030)
Urobilinogen, UA: 0.2 mg/dL (ref 0.0–1.0)
pH: 7 (ref 5.0–8.0)

## 2014-07-04 NOTE — Progress Notes (Signed)
[employer] Interpreter ID#  NO interpreter available at check in. Will attempt to call later in visit.

## 2014-07-04 NOTE — Progress Notes (Signed)
34 y.o. G3P2002 at 6968w3d here today for routine OB visit.  1. A2GDM, well controlled. Reviewed blood glucose log and mostly at goal. AM fasting 69-76 (100% at goal), 2 hr PP 83-154 although mostly 83-119 and >50% at goal). Continue glyburide 3.75 mg with breakfast. Continue diet/exercise. NST reactive today. Continue twice weekly testing and plan for induction at 39 weeks. Growth scan completed last week at [facility] (74%ile).  2. Routine PNC. FM/Labor precautions reviewed.

## 2014-07-04 NOTE — Progress Notes (Signed)
[employer] Interpreter # [phone] used for encounter.  Pt seen on 4/17 @ [facility] for r/o labor.

## 2014-07-04 NOTE — Progress Notes (Signed)
Large leukocytes in urine.  

## 2014-07-05 ENCOUNTER — Telehealth (HOSPITAL_COMMUNITY): Payer: Self-pay | Admitting: *Deleted

## 2014-07-05 NOTE — Telephone Encounter (Signed)
Preadmission screen  

## 2014-07-06 ENCOUNTER — Encounter: Payer: Self-pay | Admitting: Obstetrics & Gynecology

## 2014-07-07 ENCOUNTER — Encounter (HOSPITAL_COMMUNITY): Payer: Self-pay | Admitting: *Deleted

## 2014-07-07 ENCOUNTER — Inpatient Hospital Stay (HOSPITAL_COMMUNITY)
Admission: AD | Admit: 2014-07-07 | Discharge: 2014-07-09 | DRG: 775 | Disposition: A | Discharge status: Home / Self Care | Payer: Medicaid Other | Source: Ambulatory Visit | Attending: Family Medicine | Admitting: Family Medicine

## 2014-07-07 DIAGNOSIS — O24429 Gestational diabetes mellitus in childbirth, unspecified control: Secondary | ICD-10-CM | POA: Diagnosis present

## 2014-07-07 DIAGNOSIS — Z758 Other problems related to medical facilities and other health care: Secondary | ICD-10-CM | POA: Diagnosis present

## 2014-07-07 DIAGNOSIS — Z3A36 36 weeks gestation of pregnancy: Secondary | ICD-10-CM | POA: Diagnosis present

## 2014-07-07 DIAGNOSIS — O0993 Supervision of high risk pregnancy, unspecified, third trimester: Secondary | ICD-10-CM

## 2014-07-07 DIAGNOSIS — Z79899 Other long term (current) drug therapy: Secondary | ICD-10-CM

## 2014-07-07 DIAGNOSIS — Z3A37 37 weeks gestation of pregnancy: Secondary | ICD-10-CM

## 2014-07-07 DIAGNOSIS — O24913 Unspecified diabetes mellitus in pregnancy, third trimester: Secondary | ICD-10-CM

## 2014-07-07 DIAGNOSIS — O9989 Other specified diseases and conditions complicating pregnancy, childbirth and the puerperium: Secondary | ICD-10-CM | POA: Diagnosis present

## 2014-07-07 DIAGNOSIS — Z789 Other specified health status: Secondary | ICD-10-CM | POA: Diagnosis present

## 2014-07-07 DIAGNOSIS — IMO0001 Reserved for inherently not codable concepts without codable children: Secondary | ICD-10-CM

## 2014-07-07 LAB — CBC
HCT: 36.3 % (ref 36.0–46.0)
Hemoglobin: 12.2 g/dL (ref 12.0–15.0)
MCH: 27.9 pg (ref 26.0–34.0)
MCHC: 33.6 g/dL (ref 30.0–36.0)
MCV: 83.1 fL (ref 78.0–100.0)
PLATELETS: 276 10*3/uL (ref 150–400)
RBC: 4.37 MIL/uL (ref 3.87–5.11)
RDW: 13.4 % (ref 11.5–15.5)
WBC: 9.8 10*3/uL (ref 4.0–10.5)

## 2014-07-07 LAB — TYPE AND SCREEN
ABO/RH(D): O POS
Antibody Screen: NEGATIVE

## 2014-07-07 LAB — ABO/RH: ABO/RH(D): O POS

## 2014-07-07 LAB — GLUCOSE, CAPILLARY: Glucose-Capillary: 79 mg/dL (ref 70–99)

## 2014-07-07 MED ORDER — OXYTOCIN 40 UNITS IN LACTATED RINGERS INFUSION - SIMPLE MED
1.0000 m[IU]/min | INTRAVENOUS | Status: DC
  Administered 2014-07-07: 2 m[IU]/min via INTRAVENOUS
  Filled 2014-07-07: qty 1000

## 2014-07-07 MED ORDER — OXYTOCIN BOLUS FROM INFUSION
500.0000 mL | INTRAVENOUS | Status: DC
  Administered 2014-07-07: 500 mL via INTRAVENOUS

## 2014-07-07 MED ORDER — OXYCODONE-ACETAMINOPHEN 5-325 MG PO TABS
1.0000 | ORAL_TABLET | ORAL | Status: DC | PRN
  Administered 2014-07-07 – 2014-07-08 (×2): 1 via ORAL
  Filled 2014-07-07: qty 1

## 2014-07-07 MED ORDER — ACETAMINOPHEN 325 MG PO TABS
650.0000 mg | ORAL_TABLET | ORAL | Status: DC | PRN
Start: 2014-07-07 — End: 2014-07-08

## 2014-07-07 MED ORDER — OXYTOCIN 40 UNITS IN LACTATED RINGERS INFUSION - SIMPLE MED: 62.5000 mL/h | INTRAVENOUS | Status: DC

## 2014-07-07 MED ORDER — CITRIC ACID-SODIUM CITRATE 334-500 MG/5ML PO SOLN: 30.0000 mL | ORAL | Status: DC | PRN

## 2014-07-07 MED ORDER — TERBUTALINE SULFATE 1 MG/ML IJ SOLN
0.2500 mg | Freq: Once | INTRAMUSCULAR | Status: AC | PRN
  Filled 2014-07-07: qty 1

## 2014-07-07 MED ORDER — FLEET ENEMA 7-19 GM/118ML RE ENEM: 1.0000 | ENEMA | RECTAL | Status: DC | PRN

## 2014-07-07 MED ORDER — FENTANYL CITRATE (PF) 100 MCG/2ML IJ SOLN
50.0000 ug | INTRAMUSCULAR | Status: DC | PRN
  Administered 2014-07-07 (×2): 50 ug via INTRAVENOUS
  Filled 2014-07-07 (×2): qty 2

## 2014-07-07 MED ORDER — ONDANSETRON HCL 4 MG/2ML IJ SOLN: 4.0000 mg | Freq: Four times a day (QID) | INTRAMUSCULAR | Status: DC | PRN

## 2014-07-07 MED ORDER — LACTATED RINGERS IV SOLN: 500.0000 mL | INTRAVENOUS | Status: DC | PRN

## 2014-07-07 MED ORDER — LACTATED RINGERS IV SOLN
INTRAVENOUS | Status: DC
  Administered 2014-07-07 (×2): via INTRAVENOUS

## 2014-07-07 MED ORDER — OXYCODONE-ACETAMINOPHEN 5-325 MG PO TABS
1.0000 | ORAL_TABLET | ORAL | Status: DC | PRN
  Filled 2014-07-07: qty 1

## 2014-07-07 MED ORDER — LIDOCAINE HCL (PF) 1 % IJ SOLN
30.0000 mL | INTRAMUSCULAR | Status: DC | PRN
  Administered 2014-07-07: 30 mL via SUBCUTANEOUS
  Filled 2014-07-07: qty 30

## 2014-07-07 MED ORDER — OXYCODONE-ACETAMINOPHEN 5-325 MG PO TABS: 2.0000 | ORAL_TABLET | ORAL | Status: DC | PRN

## 2014-07-07 NOTE — MAU Note (Signed)
Pt came in by EMS C/O labor, speaks Burmese.  Skirt appears wet, no bleeding noted.

## 2014-07-07 NOTE — MAU Note (Signed)
C/o SROM @ 1220 today; denies pain;

## 2014-07-07 NOTE — MAU Note (Signed)
Used IV pain medicine with other labors; pt desires epidural via translator;

## 2014-07-07 NOTE — Progress Notes (Signed)
Baby delivered at 2143 Apgars 9 and 9 . At 5 mins of birth  baby granting 02 sats 85% transferred to the warmer blow by and decl with no fluid returning 02 sats 92% at 10 mins of birth. Called for nursery to come and assess baby.

## 2014-07-07 NOTE — H&P (Signed)
LABOR ADMISSION HISTORY AND PHYSICAL  Katherine Jones is a 34 y.o. female G3P2002 with IUP at 74w6dby LMP presenting for SROM. She stated that her water broke at 12:20pm today. She also noted contractions starting at that time, at intensity 4/10, regular, and lasting about a minute. She reports no blurry vision, headaches or peripheral edema. She plans on breast and bottle feeding. She requests Nexplanon for birth control.  Information obtained via interpreter (Burmese)  Dating: By LMP --->  Estimated Date of Delivery: 07/22/14  Sono:    @[redacted]w[redacted]d , CWD, normal anatomy, cephalic presentation, 37322G 74% EFW   Prenatal Transfer Tool  Maternal Diabetes: Yes:  Diabetes Type:  Insulin/Medication controlled Genetic Screening: Normal Maternal Ultrasounds/Referrals: Normal Fetal Ultrasounds or other Referrals:  None Maternal Substance Abuse:  No Significant Maternal Medications:  Meds include: Other: glyburide Significant Maternal Lab Results: Lab values include: Group B Strep negative   Prenatal History/Complications:  Past Medical History: Past Medical History  Diagnosis Date  . Gestational diabetes     Past Surgical History: History reviewed. No pertinent past surgical history.  Obstetrical History: OB History    Gravida Para Term Preterm AB TAB SAB Ectopic Multiple Living   3 2 2       2       Social History: History   Social History  . Marital Status: Married    Spouse Name: N/A  . Number of Children: N/A  . Years of Education: N/A   Social History Main Topics  . Smoking status: Never Smoker   . Smokeless tobacco: Never Used  . Alcohol Use: No  . Drug Use: No  . Sexual Activity: Yes    Birth Control/ Protection: None   Other Topics Concern  . None   Social History Narrative    Family History: Family History  Problem Relation Age of Onset  . Alcohol abuse Neg Hx   . Arthritis Neg Hx   . Asthma Neg Hx   . Birth defects Neg Hx   . Cancer Neg Hx   . COPD Neg Hx    . Depression Neg Hx   . Diabetes Neg Hx   . Drug abuse Neg Hx   . Early death Neg Hx   . Hearing loss Neg Hx   . Heart disease Neg Hx   . Hyperlipidemia Neg Hx   . Hypertension Neg Hx   . Kidney disease Neg Hx   . Learning disabilities Neg Hx   . Mental illness Neg Hx   . Mental retardation Neg Hx   . Miscarriages / Stillbirths Neg Hx   . Stroke Neg Hx   . Vision loss Neg Hx   . Varicose Veins Neg Hx     Allergies: No Known Allergies  Prescriptions prior to admission  Medication Sig Dispense Refill Last Dose  . aspirin 81 MG chewable tablet Chew 1 tablet (81 mg total) by mouth daily. 30 tablet 10 Past Week at Unknown time  . Prenatal Vit-Fe Fumarate-FA (PRENATAL VITAMINS PLUS) 27-1 MG TABS Take 1 tablet by mouth daily.   Past Week at Unknown time  . ACCU-CHEK FASTCLIX LANCETS MISC 1 Units by Does not apply route 4 (four) times daily. 102 each 3 Taking  . Blood Glucose Monitoring Suppl (ACCU-CHEK NANO SMARTVIEW) W/DEVICE KIT 1 Device by Does not apply route 4 (four) times daily. 1 kit 0 Taking  . glucose blood (ACCU-CHEK SMARTVIEW) test strip Check CBG 4 times daily 51 each 12 Taking  . glyBURIDE (DIABETA)  2.5 MG tablet Take 1.5 tablets (3.75 mg total) by mouth daily with breakfast. (Patient not taking: Reported on 06/20/2014) 30 tablet 3 Not Taking     Review of Systems   All systems reviewed and negative except as stated in HPI  Blood pressure 107/67, pulse 89, temperature 98.5 F (36.9 C), temperature source Oral, resp. rate 18, height 4' 7"  (1.397 m), weight 120 lb (54.432 kg), last menstrual period 10/15/2013, SpO2 100 %. General appearance: alert, cooperative and no distress Lungs: clear to auscultation bilaterally, normal work of breathing Heart: regular rate and rhythm Abdomen: soft, non-tender Extremities: No edema noted Presentation: cephalic Fetal monitoringBaseline: 150 bpm, Variability: Good {> 6 bpm) and Accelerations: Reactive Uterine activityDate/time of  onset: 07/07/14 @ 1220pm and Intensity: mild Dilation: 3.5 Effacement (%): 50 Station: -3 Exam by:: Limmie Patricia, RNC   Prenatal labs: ABO, Rh: --/--/O POS (04/21 1515) Antibody: PENDING (04/21 1515) Rubella:  Immune RPR: NON REAC (02/09 1454)  HBsAg: Negative (10/12 0000)  HIV: NONREACTIVE (02/09 1454)  GBS: Negative (04/11 0000)  1 hr Glucola 154 Genetic screening  normal Anatomy US normal   Results for orders placed or performed during the hospital encounter of 07/07/14 (from the past 24 hour(s))  CBC   Collection Time: 07/07/14  3:15 PM  Result Value Ref Range   WBC 9.8 4.0 - 10.5 K/uL   RBC 4.37 3.87 - 5.11 MIL/uL   Hemoglobin 12.2 12.0 - 15.0 g/dL   HCT 36.3 36.0 - 46.0 %   MCV 83.1 78.0 - 100.0 fL   MCH 27.9 26.0 - 34.0 pg   MCHC 33.6 30.0 - 36.0 g/dL   RDW 13.4 11.5 - 15.5 %   Platelets 276 150 - 400 K/uL  Type and screen   Collection Time: 07/07/14  3:15 PM  Result Value Ref Range   ABO/RH(D) O POS    Antibody Screen PENDING    Sample Expiration 07/10/2014     Patient Active Problem List   Diagnosis Date Noted  . Active labor 07/07/2014  . Fetal heart deceleration   . Diabetes in undelivered pregnancy   . [redacted] weeks gestation of pregnancy   . Diabetes mellitus in pregnancy, antepartum 02/14/2014  . Supervision of high risk pregnancy, antepartum 02/14/2014  . Language barrier, speaks [place] 01/04/2014    Assessment: Katherine Jones is a 34 y.o. G3P2002 at 40w6dwith a hx of A2GDM here for SROM  #Labor:Expectant management. Considering A2GDM and upper normal EFW will need to monitor progression of labor for potential arrest or shoulder dystocia #Pain: Considering epidural, will need more explanation on procedure. Otherwise IV px med #FWB: Cat 1 #ID:  GBS negative #MOF: Breast and bottle #MOC:Nexplanon #A2DM: q2h CBGs  Katherine Walgren ROCIO, MD  07/07/2014, 4:20 PM

## 2014-07-08 ENCOUNTER — Other Ambulatory Visit: Payer: Medicaid Other

## 2014-07-08 LAB — RPR: RPR: NONREACTIVE

## 2014-07-08 MED ORDER — ACETAMINOPHEN 325 MG PO TABS: 650.0000 mg | ORAL_TABLET | ORAL | Status: DC | PRN

## 2014-07-08 MED ORDER — DIPHENHYDRAMINE HCL 25 MG PO CAPS: 25.0000 mg | ORAL_CAPSULE | Freq: Four times a day (QID) | ORAL | Status: DC | PRN

## 2014-07-08 MED ORDER — SIMETHICONE 80 MG PO CHEW: 80.0000 mg | CHEWABLE_TABLET | ORAL | Status: DC | PRN

## 2014-07-08 MED ORDER — OXYCODONE-ACETAMINOPHEN 5-325 MG PO TABS: 2.0000 | ORAL_TABLET | ORAL | Status: DC | PRN

## 2014-07-08 MED ORDER — ONDANSETRON HCL 4 MG/2ML IJ SOLN: 4.0000 mg | INTRAMUSCULAR | Status: DC | PRN

## 2014-07-08 MED ORDER — ZOLPIDEM TARTRATE 5 MG PO TABS: 5.0000 mg | ORAL_TABLET | Freq: Every evening | ORAL | Status: DC | PRN

## 2014-07-08 MED ORDER — LANOLIN HYDROUS EX OINT: TOPICAL_OINTMENT | CUTANEOUS | Status: DC | PRN

## 2014-07-08 MED ORDER — SENNOSIDES-DOCUSATE SODIUM 8.6-50 MG PO TABS
2.0000 | ORAL_TABLET | ORAL | Status: DC
  Administered 2014-07-08 (×2): 2 via ORAL
  Filled 2014-07-08 (×2): qty 2

## 2014-07-08 MED ORDER — PRENATAL MULTIVITAMIN CH
1.0000 | ORAL_TABLET | Freq: Every day | ORAL | Status: DC
Start: 2014-07-08 — End: 2014-07-09
  Administered 2014-07-08 – 2014-07-09 (×2): 1 via ORAL
  Filled 2014-07-08 (×2): qty 1

## 2014-07-08 MED ORDER — BENZOCAINE-MENTHOL 20-0.5 % EX AERO
1.0000 "application " | INHALATION_SPRAY | CUTANEOUS | Status: DC | PRN
  Administered 2014-07-08: 1 via TOPICAL
  Filled 2014-07-08: qty 56

## 2014-07-08 MED ORDER — DIBUCAINE 1 % RE OINT: 1.0000 "application " | TOPICAL_OINTMENT | RECTAL | Status: DC | PRN

## 2014-07-08 MED ORDER — IBUPROFEN 600 MG PO TABS
600.0000 mg | ORAL_TABLET | Freq: Four times a day (QID) | ORAL | Status: DC
Start: 2014-07-08 — End: 2014-07-09
  Administered 2014-07-08 – 2014-07-09 (×6): 600 mg via ORAL
  Filled 2014-07-08 (×6): qty 1

## 2014-07-08 MED ORDER — ONDANSETRON HCL 4 MG PO TABS: 4.0000 mg | ORAL_TABLET | ORAL | Status: DC | PRN

## 2014-07-08 MED ORDER — WITCH HAZEL-GLYCERIN EX PADS: 1.0000 "application " | MEDICATED_PAD | CUTANEOUS | Status: DC | PRN

## 2014-07-08 MED ORDER — TETANUS-DIPHTH-ACELL PERTUSSIS 5-2.5-18.5 LF-MCG/0.5 IM SUSP: 0.5000 mL | Freq: Once | INTRAMUSCULAR | Status: DC

## 2014-07-08 NOTE — Progress Notes (Signed)
With admission of patient [facility] language line was requested for the language [place]. I was told that they did not have an interpreter dialed in at this time to interpret and that they were sorry. With the patient was the support person and a friend. I was able to contact an interpreter through [place] that spoke [place] and was able to speak with Dad so that he could then tell mom. This was very difficult to do because mom is hard of hearing and Dad is unable to translate everything that the interpreter is saying in [place] to [place].  I was able to go over admission paperwork with mom and dad.  With the 1 hour check Dad was unavailable and once again the [place] language line stated that they would not have a [place] interpreter dialed in until several hours later. I was able to get mom to the bathroom in order to do peri care and for her to void.  Very little education has been done because I have not had access to the correct interpreter.

## 2014-07-08 NOTE — Progress Notes (Addendum)
Interpreter line used to explain assessment of patient. Also discussed the plan for the night, nightly medications, the infant's hep B vaccine, the heart screen and the PKU procedure. Mother stated that she had all her meals for the day and needed nothing further at this time.  Mother also stated that she doesn't have a car seat and would like to buy one from the hospital.

## 2014-07-08 NOTE — Progress Notes (Signed)
Post Partum Day 1 Subjective: [employer] for Katherine Jones used for today's examination.  Katherine Jones is a 34 y.o. [id number] 2838w6d s/p SVD.  No acute events overnight.  Pt denies problems with ambulating, voiding or po intake.  She denies nausea or vomiting.  Pain is well controlled.  She has had flatus. She has not had bowel movement.  Lochia Moderate.  Plan for birth control is nexplanon.  Method of Feeding: breast/bottle.  Objective: Blood pressure 98/62, pulse 77, temperature 97.7 F (36.5 C), temperature source Oral, resp. rate 18, height 4\' 7"  (1.397 m), weight 120 lb (54.432 kg), last menstrual period 10/15/2013, SpO2 100 %, unknown if currently breastfeeding.  Physical Exam:  General: alert, cooperative and no distress Lochia:normal flow Chest: CTAB, no increased WOB Heart: RRR, no m/r/g, +2 DP Abdomen: +BS, soft, nontender Uterine Fundus: firm, below the level of the umbilicus DVT Evaluation: No evidence of DVT seen on physical exam. Extremities: WWP, trace non-pitting edema on b/l LE   Recent Labs  07/07/14 1515  HGB 12.2  HCT 36.3    Assessment/Plan:  ASSESSMENT: Katherine Jones is a 34 y.o. O1H0865G3P3003 4238w6d s/p SVD  Plan for discharge tomorrow   LOS: 1 day   Delynn FlavinGottschalk, Ashly M, DO 07/08/2014, 7:43 AM  I have seen and examined this patient and agree the above assessment. CRESENZO-DISHMAN,Katherine Jones 07/08/2014 8:08 AM

## 2014-07-08 NOTE — Progress Notes (Signed)
Used [employer]--Kareni-- this am about 1000.  Discussed with the patient how to use the bulb syringe when baby is choking.  Baby has nasal congesgtion and discussed how not to use the bulb in the nose because this will make the congestion worse and also used saline in the nose and explained it to mom.  Also discussed when to feed the baby.  Used Interpreter to discuss vag bleeding, peri care with ice and spray.  Discussed pain and medication  And used the faces on the wipe off board as to when she needs stronger pain medication.  Also discussed baby safety for not sleeping in the bed with mom and laying her on her back when in the crib.   I also took moms order for lunch and dinner tonight and breakfast tomorrow so she will be able to get meals thru the morning.

## 2014-07-08 NOTE — Progress Notes (Signed)
Faculty practice resident contacted to inquire about any blood sugar orders. Resident stated that there were no orders continued to check patients blood sugars. Also, asked about if that patient was to have a tubal today.  Resident stated that patient had changed her mind and does no longer desire a tubal.

## 2014-07-08 NOTE — Lactation Note (Addendum)
This note was copied from the chart of Katherine Treva Lietzke. Lactation Consultation Note: [employer] Interpreter on speaker phone for teaching with mother. Mother is an experienced breastfeeding mother. She states that she breastfed her first child for 1 1/2 years and her second child for 2 years. Mother states that she never gave formula to her other children. Reviewed hand expression and observed drops of colostrum. Mother latched infant on in cradle hold.  Infant has nasal congestion and is popping on and off the breast. Observed infant latching on and off for 10-15 mins.  Mother switched to alternate breast and infant was able to sustain latch for 15 mins.observed strong tugging  with a few swallows observed.  Advised mother to feed infant 8-12 times in 24 hours as well as with feeding cues. Mother is receptive to all teaching. Reviewed proper latch from Baby and me book. Mother denies feeling any pain with latch.   Mother Advised mother to log wet and dirty diapers.  Patient Name: Katherine Jones Reason for consult: Initial assessment   Maternal Data Has patient been taught Hand Expression?: Yes Does the patient have breastfeeding experience prior to this delivery?: Yes  Feeding Feeding Type: Breast Fed Length of feed: 15 min  LATCH Score/Interventions Latch: Grasps breast easily, tongue down, lips flanged, rhythmical sucking.  Audible Swallowing: A few with stimulation Intervention(s): Hand expression;Alternate breast massage  Type of Nipple: Everted at rest and after stimulation  Comfort (Breast/Nipple): Soft / non-tender     Hold (Positioning): No assistance needed to correctly position infant at breast. Intervention(s): Breastfeeding basics reviewed;Support Pillows;Position options;Skin to skin  LATCH Score: 9  Lactation Tools Discussed/Used     Consult Status Consult Status: Follow-up Date: 07/08/14 Follow-up type: In-patient    Stevan BornKendrick, [name]  [facility], 12:51 PM

## 2014-07-08 NOTE — Progress Notes (Signed)
UR chart review completed.  

## 2014-07-09 MED ORDER — IBUPROFEN 600 MG PO TABS: 600.0000 mg | ORAL_TABLET | Freq: Four times a day (QID) | ORAL | Status: DC | PRN

## 2014-07-09 NOTE — Discharge Summary (Signed)
Obstetric Discharge Summary Reason for Admission: rupture of membranes Prenatal Procedures: NST and ultrasound Intrapartum Procedures: spontaneous vaginal delivery Postpartum Procedures: none Complications-Operative and Postpartum: 2nd degree perineal laceration Eating, drinking, voiding, ambulating well.  +flatus.  Lochia and pain wnl.  Denies dizziness, lightheadedness, or sob. No complaints.   Hospital Course: Katherine Jones is a 34 y.o. 363P3003 female admited at 4537.6wks w/ SROM/early labor. She progressed normally to uncomplicated SVB. A2/B DM during pregnancy. She has only had 1 cbg since admission and it was normal at 79. Her pp course has been uncomplicated.  By PPD#2 she is doing well and is deemed to have received the full benefit of her hospital stay.  Filed Vitals:   07/09/14 0510  BP: 115/69  Pulse: 78  Temp: 97.8 F (36.6 C)  Resp: 18   H/H: Lab Results  Component Value Date/Time   HGB 12.2 07/07/2014 03:15 PM   HGB 12.6 12/27/2013   HCT 36.3 07/07/2014 03:15 PM   HCT 39 12/27/2013    Physical Exam: General: alert, cooperative and no distress Abdomen/Uterine Fundus: Appropriately tender, non-distended, FF @ U-2 Incision: n/a Lochia: appropriate Extremities: No evidence of DVT seen on physical exam. Negative Homan's sign, no cords, calf tenderness, or significant calf/ankle edema   Discharge Diagnoses: Term Pregnancy-delivered  Discharge Information: Date: 09/27/2010 Activity: pelvic rest Diet: routine  Medications: PNV and Ibuprofen Breast feeding: Yes Contraception: abstinence until nexplanon Circumcision: n/a Condition: stable Instructions: refer to handout Discharge to: home  Infant: Home with mother  Follow-up Information    [facility].   Specialty:  Obstetrics and Gynecology   Why:  at 2:00pm for your postpartum visit   Contact information:   [address] [place] [id number] [phone]     Will need 2hr gtt 6-8wks pp d/t A2/B DM  Exam/discussion assisted via [facility] interpreter Marge DuncansBooker, Kimberly Randall, CNM, WHNP-BC 07/09/2014,10:01 AM

## 2014-07-09 NOTE — Discharge Instructions (Signed)
Postpartum Care After Vaginal Delivery  °After you deliver your newborn (postpartum period), the usual stay in the hospital is 24-72 hours. If there were problems with your labor or delivery, or if you have other medical problems, you might be in the hospital longer.  °While you are in the hospital, you will receive help and instructions on how to care for yourself and your newborn during the postpartum period.  °While you are in the hospital:  °Be sure to tell your nurses if you have pain or discomfort, as well as where you feel the pain and what makes the pain worse.  °If you had an incision made near your vagina (episiotomy) or if you had some tearing during delivery, the nurses may put ice packs on your episiotomy or tear. The ice packs may help to reduce the pain and swelling.  °If you are breastfeeding, you may feel uncomfortable contractions of your uterus for a couple of weeks. This is normal. The contractions help your uterus get back to normal size.  °It is normal to have some bleeding after delivery.  °For the first 1-3 days after delivery, the flow is red and the amount may be similar to a period.  °It is common for the flow to start and stop.  °In the first few days, you may pass some small clots. Let your nurses know if you begin to pass large clots or your flow increases.  °Do not flush blood clots down the toilet before having the nurse look at them.  °During the next 3-10 days after delivery, your flow should become more watery and pink or brown-tinged in color.  °Ten to fourteen days after delivery, your flow should be a small amount of yellowish-white discharge.  °The amount of your flow will decrease over the first few weeks after delivery. Your flow may stop in 6-8 weeks. Most women have had their flow stop by 12 weeks after delivery. °You should change your sanitary pads frequently.  °Wash your hands thoroughly with soap and water for at least 20 seconds after changing pads, using the toilet,  or before holding or feeding your newborn.  °You should feel like you need to empty your bladder within the first 6-8 hours after delivery.  °In case you become weak, lightheaded, or faint, call your nurse before you get out of bed for the first time and before you take a shower for the first time.  °Within the first few days after delivery, your breasts may begin to feel tender and full. This is called engorgement. Breast tenderness usually goes away within 48-72 hours after engorgement occurs. You may also notice milk leaking from your breasts. If you are not breastfeeding, do not stimulate your breasts. Breast stimulation can make your breasts produce more milk.  °Spending as much time as possible with your newborn is very important. During this time, you and your newborn can feel close and get to know each other. Having your newborn stay in your room (rooming in) will help to strengthen the bond with your newborn. It will give you time to get to know your newborn and become comfortable caring for your newborn.  °Your hormones change after delivery. Sometimes the hormone changes can temporarily cause you to feel sad or tearful. These feelings should not last more than a few days. If these feelings last longer than that, you should talk to your caregiver.  °If desired, talk to your caregiver about methods of family planning or contraception.  °  Talk to your caregiver about immunizations. Your caregiver may want you to have the following immunizations before leaving the hospital:  °Tetanus, diphtheria, and pertussis (Tdap) or tetanus and diphtheria (Td) immunization. It is very important that you and your family (including grandparents) or others caring for your newborn are up-to-date with the Tdap or Td immunizations. The Tdap or Td immunization can help protect your newborn from getting ill.  °Rubella immunization.  °Varicella (chickenpox) immunization.  °Influenza immunization. You should receive this annual  immunization if you did not receive the immunization during your pregnancy. °Document Released: 12/30/2006 Document Revised: 11/27/2011 Document Reviewed: 10/30/2011  °ExitCare® Patient Information ©2015 [employer]. This information is not intended to replace advice given to you by your health care provider. Make sure you discuss any questions you have with your health care provider.  ° °Breastfeeding °Deciding to breastfeed is one of the best choices you can make for you and your baby. A change in hormones during pregnancy causes your breast tissue to grow and increases the number and size of your milk ducts. These hormones also allow proteins, sugars, and fats from your blood supply to make breast milk in your milk-producing glands. Hormones prevent breast milk from being released before your baby is born as well as prompt milk flow after birth. Once breastfeeding has begun, thoughts of your baby, as well as his or her sucking or crying, can stimulate the release of milk from your milk-producing glands.  °BENEFITS OF BREASTFEEDING °For Your Baby °· Your first milk (colostrum) helps your baby's digestive system function better.   °· There are antibodies in your milk that help your baby fight off infections.   °· Your baby has a lower incidence of asthma, allergies, and sudden infant death syndrome.   °· The nutrients in breast milk are better for your baby than infant formulas and are designed uniquely for your baby's needs.   °· Breast milk improves your baby's brain development.   °· Your baby is less likely to develop other conditions, such as childhood obesity, asthma, or type 2 diabetes mellitus.   °For You  °· Breastfeeding helps to create a very special bond between you and your baby.   °· Breastfeeding is convenient. Breast milk is always available at the correct temperature and costs nothing.   °· Breastfeeding helps to burn calories and helps you lose the weight gained during pregnancy.    °· Breastfeeding makes your uterus contract to its prepregnancy size faster and slows bleeding (lochia) after you give birth.   °· Breastfeeding helps to lower your risk of developing type 2 diabetes mellitus, osteoporosis, and breast or ovarian cancer later in life. °SIGNS THAT YOUR BABY IS HUNGRY °Early Signs of Hunger  °· Increased alertness or activity. °· Stretching. °· Movement of the head from side to side. °· Movement of the head and opening of the mouth when the corner of the mouth or cheek is stroked (rooting). °· Increased sucking sounds, smacking lips, cooing, sighing, or squeaking. °· Hand-to-mouth movements. °· Increased sucking of fingers or hands. °Late Signs of Hunger °· Fussing. °· Intermittent crying. °Extreme Signs of Hunger °Signs of extreme hunger will require calming and consoling before your baby will be able to breastfeed successfully. Do not wait for the following signs of extreme hunger to occur before you initiate breastfeeding:   °· Restlessness. °· A loud, strong cry. °·  Screaming. °BREASTFEEDING BASICS °Breastfeeding Initiation °· Find a comfortable place to sit or lie down, with your neck and back well supported. °· Place a pillow or   rolled up blanket under your baby to bring him or her to the level of your breast (if you are seated). Nursing pillows are specially designed to help support your arms and your baby while you breastfeed. °· Make sure that your baby's abdomen is facing your abdomen.   °· Gently massage your breast. With your fingertips, massage from your chest wall toward your nipple in a circular motion. This encourages milk flow. You may need to continue this action during the feeding if your milk flows slowly. °· Support your breast with 4 fingers underneath and your thumb above your nipple. Make sure your fingers are well away from your nipple and your baby's mouth.   °· Stroke your baby's lips gently with your finger or nipple.   °· When your baby's mouth is open  wide enough, quickly bring your baby to your breast, placing your entire nipple and as much of the colored area around your nipple (areola) as possible into your baby's mouth.   °¨ More areola should be visible above your baby's upper lip than below the lower lip.   °¨ Your baby's tongue should be between his or her lower gum and your breast.   °· Ensure that your baby's mouth is correctly positioned around your nipple (latched). Your baby's lips should create a seal on your breast and be turned out (everted). °· It is common for your baby to suck about 2-3 minutes in order to start the flow of breast milk. °Latching °Teaching your baby how to latch on to your breast properly is very important. An improper latch can cause nipple pain and decreased milk supply for you and poor weight gain in your baby. Also, if your baby is not latched onto your nipple properly, he or she may swallow some air during feeding. This can make your baby fussy. Burping your baby when you switch breasts during the feeding can help to get rid of the air. However, teaching your baby to latch on properly is still the best way to prevent fussiness from swallowing air while breastfeeding. °Signs that your baby has successfully latched on to your nipple:    °· Silent tugging or silent sucking, without causing you pain.   °· Swallowing heard between every 3-4 sucks.   °·  Muscle movement above and in front of his or her ears while sucking.   °Signs that your baby has not successfully latched on to nipple:  °· Sucking sounds or smacking sounds from your baby while breastfeeding. °· Nipple pain. °If you think your baby has not latched on correctly, slip your finger into the corner of your baby's mouth to break the suction and place it between your baby's gums. Attempt breastfeeding initiation again. °Signs of Successful Breastfeeding °Signs from your baby:   °· A gradual decrease in the number of sucks or complete cessation of sucking.   °· Falling  asleep.   °· Relaxation of his or her body.   °· Retention of a small amount of milk in his or her mouth.   °· Letting go of your breast by himself or herself. °Signs from you: °· Breasts that have increased in firmness, weight, and size 1-3 hours after feeding.   °· Breasts that are softer immediately after breastfeeding. °· Increased milk volume, as well as a change in milk consistency and color by the fifth day of breastfeeding.   °· Nipples that are not sore, cracked, or bleeding. °Signs That Your Baby is Getting Enough Milk °· Wetting at least 3 diapers in a 24-hour period. The urine should be clear and   pale yellow by age 5 days. °· At least 3 stools in a 24-hour period by age 5 days. The stool should be soft and yellow. °· At least 3 stools in a 24-hour period by age 7 days. The stool should be seedy and yellow. °· No loss of weight greater than 10% of birth weight during the first 3 days of age. °· Average weight gain of 4-7 ounces (113-198 g) per week after age 4 days. °· Consistent daily weight gain by age 5 days, without weight loss after the age of 2 weeks. °After a feeding, your baby may spit up a small amount. This is common. °BREASTFEEDING FREQUENCY AND DURATION °Frequent feeding will help you make more milk and can prevent sore nipples and breast engorgement. Breastfeed when you feel the need to reduce the fullness of your breasts or when your baby shows signs of hunger. This is called "breastfeeding on demand." Avoid introducing a pacifier to your baby while you are working to establish breastfeeding (the first 4-6 weeks after your baby is born). After this time you may choose to use a pacifier. Research has shown that pacifier use during the first year of a baby's life decreases the risk of sudden infant death syndrome (SIDS). °Allow your baby to feed on each breast as long as he or she wants. Breastfeed until your baby is finished feeding. When your baby unlatches or falls asleep while feeding from  the first breast, offer the second breast. Because newborns are often sleepy in the first few weeks of life, you may need to awaken your baby to get him or her to feed. °Breastfeeding times will vary from baby to baby. However, the following rules can serve as a guide to help you ensure that your baby is properly fed: °· Newborns (babies 4 weeks of age or younger) may breastfeed every 1-3 hours. °· Newborns should not go longer than 3 hours during the day or 5 hours during the night without breastfeeding. °· You should breastfeed your baby a minimum of 8 times in a 24-hour period until you begin to introduce solid foods to your baby at around 6 months of age. °BREAST MILK PUMPING °Pumping and storing breast milk allows you to ensure that your baby is exclusively fed your breast milk, even at times when you are unable to breastfeed. This is especially important if you are going back to work while you are still breastfeeding or when you are not able to be present during feedings. Your lactation consultant can give you guidelines on how long it is safe to store breast milk.  °A breast pump is a machine that allows you to pump milk from your breast into a sterile bottle. The pumped breast milk can then be stored in a refrigerator or freezer. Some breast pumps are operated by hand, while others use electricity. Ask your lactation consultant which type will work best for you. Breast pumps can be purchased, but some hospitals and breastfeeding support groups lease breast pumps on a monthly basis. A lactation consultant can teach you how to hand express breast milk, if you prefer not to use a pump.  °CARING FOR YOUR BREASTS WHILE YOU BREASTFEED °Nipples can become dry, cracked, and sore while breastfeeding. The following recommendations can help keep your breasts moisturized and healthy: °· Avoid using soap on your nipples.   °· Wear a supportive bra. Although not required, special nursing bras and tank tops are designed to  allow access to your breasts for   breastfeeding without taking off your entire bra or top. Avoid wearing underwire-style bras or extremely tight bras. °· Air dry your nipples for 3-4 minutes after each feeding.   °· Use only cotton bra pads to absorb leaked breast milk. Leaking of breast milk between feedings is normal.   °· Use lanolin on your nipples after breastfeeding. Lanolin helps to maintain your skin's normal moisture barrier. If you use pure lanolin, you do not need to wash it off before feeding your baby again. Pure lanolin is not toxic to your baby. You may also hand express a few drops of breast milk and gently massage that milk into your nipples and allow the milk to air dry. °In the first few weeks after giving birth, some women experience extremely full breasts (engorgement). Engorgement can make your breasts feel heavy, warm, and tender to the touch. Engorgement peaks within 3-5 days after you give birth. The following recommendations can help ease engorgement: °· Completely empty your breasts while breastfeeding or pumping. You may want to start by applying warm, moist heat (in the shower or with warm water-soaked hand towels) just before feeding or pumping. This increases circulation and helps the milk flow. If your baby does not completely empty your breasts while breastfeeding, pump any extra milk after he or she is finished. °· Wear a snug bra (nursing or regular) or tank top for 1-2 days to signal your body to slightly decrease milk production. °· Apply ice packs to your breasts, unless this is too uncomfortable for you. °· Make sure that your baby is latched on and positioned properly while breastfeeding. °If engorgement persists after 48 hours of following these recommendations, contact your health care provider or a lactation consultant. °OVERALL HEALTH CARE RECOMMENDATIONS WHILE BREASTFEEDING °· Eat healthy foods. Alternate between meals and snacks, eating 3 of each per day. Because what you  eat affects your breast milk, some of the foods may make your baby more irritable than usual. Avoid eating these foods if you are sure that they are negatively affecting your baby. °· Drink milk, fruit juice, and water to satisfy your thirst (about 10 glasses a day).   °· Rest often, relax, and continue to take your prenatal vitamins to prevent fatigue, stress, and anemia. °· Continue breast self-awareness checks. °· Avoid chewing and smoking tobacco. °· Avoid alcohol and drug use. °Some medicines that may be harmful to your baby can pass through breast milk. It is important to ask your health care provider before taking any medicine, including all over-the-counter and prescription medicine as well as vitamin and herbal supplements. °It is possible to become pregnant while breastfeeding. If birth control is desired, ask your health care provider about options that will be safe for your baby. °SEEK MEDICAL CARE IF:  °· You feel like you want to stop breastfeeding or have become frustrated with breastfeeding. °· You have painful breasts or nipples. °· Your nipples are cracked or bleeding. °· Your breasts are red, tender, or warm. °· You have a swollen area on either breast. °· You have a fever or chills. °· You have nausea or vomiting. °· You have drainage other than breast milk from your nipples. °· Your breasts do not become full before feedings by the fifth day after you give birth. °· You feel sad and depressed. °· Your baby is too sleepy to eat well. °· Your baby is having trouble sleeping.   °· Your baby is wetting less than 3 diapers in a 24-hour period. °· Your baby has   less than 3 stools in a 24-hour period. °· Your baby's skin or the white part of his or her eyes becomes yellow.   °· Your baby is not gaining weight by 5 days of age. °SEEK IMMEDIATE MEDICAL CARE IF:  °· Your baby is overly tired (lethargic) and does not want to wake up and feed. °· Your baby develops an unexplained fever. °Document Released:  03/04/2005 Document Revised: 03/09/2013 Document Reviewed: 08/26/2012 °Exit[employer]® Patient Information ©2015 [employer]. This information is not intended to replace advice given to you by your health care provider. Make sure you discuss any questions you have with your health care provider. ° °

## 2014-07-10 ENCOUNTER — Ambulatory Visit: Payer: Self-pay

## 2014-07-10 NOTE — Lactation Note (Signed)
This note was copied from the chart of Katherine Jones. Lactation Consultation Note  [employer] Interpreter Kareeni BKPM P3, Ex BF.  Breastfed both older children for more than a year.   Reminded mother to undress baby and do STS and feed longer than 10 min. Massage her breasts to keep baby active, burp and switch and feed on both breasts. Reviewed engorgement care and monitoring voids/stools. Mother denies any questions or problems. Mom encouraged to feed baby 8-12 times/24 hours and with feeding cues.      Patient Name: Katherine Ricky AlaShu [place] KGMWN'UToday's Date: 07/10/2014 Reason for consult: Follow-up assessment   Maternal Data    Feeding    LATCH Score/Interventions                      Lactation Tools Discussed/Used     Consult Status Consult Status: Complete    Hardie PulleyBerkelhammer, Emad Brechtel Boschen 07/10/2014, 11:42 AM

## 2014-07-10 NOTE — Progress Notes (Signed)
Acknowledged order for social work consult because mother stated she did not have an infant car seat.  Spoke with parents via language line interpreter Reh ID# [id number].  Father stated that they do have a car seat for newborn in their vehicle.  Instructed father to bring the car seat to the room.  No further social work intervention needed at this time.  

## 2014-07-11 ENCOUNTER — Other Ambulatory Visit: Payer: Medicaid Other

## 2014-07-15 ENCOUNTER — Other Ambulatory Visit: Payer: Medicaid Other

## 2014-07-15 ENCOUNTER — Inpatient Hospital Stay (HOSPITAL_COMMUNITY): Admission: RE | Admit: 2014-07-15 | Payer: Medicaid Other | Source: Ambulatory Visit

## 2014-08-18 ENCOUNTER — Ambulatory Visit (INDEPENDENT_AMBULATORY_CARE_PROVIDER_SITE_OTHER): Payer: Medicaid Other | Admitting: Advanced Practice Midwife

## 2014-08-18 ENCOUNTER — Encounter: Payer: Self-pay | Admitting: Advanced Practice Midwife

## 2014-08-18 VITALS — BP 107/78 | HR 78 | Temp 97.6°F | Wt 99.6 lb

## 2014-08-18 DIAGNOSIS — Z30017 Encounter for initial prescription of implantable subdermal contraceptive: Secondary | ICD-10-CM

## 2014-08-18 DIAGNOSIS — Z3202 Encounter for pregnancy test, result negative: Secondary | ICD-10-CM | POA: Diagnosis not present

## 2014-08-18 DIAGNOSIS — Z30013 Encounter for initial prescription of injectable contraceptive: Secondary | ICD-10-CM

## 2014-08-18 DIAGNOSIS — Z3049 Encounter for surveillance of other contraceptives: Secondary | ICD-10-CM | POA: Diagnosis not present

## 2014-08-18 LAB — POCT PREGNANCY, URINE: Preg Test, Ur: NEGATIVE

## 2014-08-18 MED ORDER — ETONOGESTREL 68 MG ~~LOC~~ IMPL
68.0000 mg | DRUG_IMPLANT | Freq: Once | SUBCUTANEOUS | Status: AC
  Administered 2014-08-18: 68 mg via SUBCUTANEOUS

## 2014-08-18 NOTE — Patient Instructions (Signed)
Etonogestrel implant What is this medicine? ETONOGESTREL (et oh noe JES trel) is a contraceptive (birth control) device. It is used to prevent pregnancy. It can be used for up to 3 years. This medicine may be used for other purposes; ask your health care provider or pharmacist if you have questions. COMMON BRAND NAME(S): Implanon, Nexplanon What should I tell my health care provider before I take this medicine? They need to know if you have any of these conditions: -abnormal vaginal bleeding -blood vessel disease or blood clots -cancer of the breast, cervix, or liver -depression -diabetes -gallbladder disease -headaches -heart disease or recent heart attack -high blood pressure -high cholesterol -kidney disease -liver disease -renal disease -seizures -tobacco smoker -an unusual or allergic reaction to etonogestrel, other hormones, anesthetics or antiseptics, medicines, foods, dyes, or preservatives -pregnant or trying to get pregnant -breast-feeding How should I use this medicine? This device is inserted just under the skin on the inner side of your upper arm by a health care professional. Talk to your pediatrician regarding the use of this medicine in children. Special care may be needed. Overdosage: If you think you've taken too much of this medicine contact a poison control center or emergency room at once. Overdosage: If you think you have taken too much of this medicine contact a poison control center or emergency room at once. NOTE: This medicine is only for you. Do not share this medicine with others. What if I miss a dose? This does not apply. What may interact with this medicine? Do not take this medicine with any of the following medications: -amprenavir -bosentan -fosamprenavir This medicine may also interact with the following medications: -barbiturate medicines for inducing sleep or treating seizures -certain medicines for fungal infections like ketoconazole and  itraconazole -griseofulvin -medicines to treat seizures like carbamazepine, felbamate, oxcarbazepine, phenytoin, topiramate -modafinil -phenylbutazone -rifampin -some medicines to treat HIV infection like atazanavir, indinavir, lopinavir, nelfinavir, tipranavir, ritonavir -St. John's wort This list may not describe all possible interactions. Give your health care provider a list of all the medicines, herbs, non-prescription drugs, or dietary supplements you use. Also tell them if you smoke, drink alcohol, or use illegal drugs. Some items may interact with your medicine. What should I watch for while using this medicine? This product does not protect you against HIV infection (AIDS) or other sexually transmitted diseases. You should be able to feel the implant by pressing your fingertips over the skin where it was inserted. Tell your doctor if you cannot feel the implant. What side effects may I notice from receiving this medicine? Side effects that you should report to your doctor or health care professional as soon as possible: -allergic reactions like skin rash, itching or hives, swelling of the face, lips, or tongue -breast lumps -changes in vision -confusion, trouble speaking or understanding -dark urine -depressed mood -general ill feeling or flu-like symptoms -light-colored stools -loss of appetite, nausea -right upper belly pain -severe headaches -severe pain, swelling, or tenderness in the abdomen -shortness of breath, chest pain, swelling in a leg -signs of pregnancy -sudden numbness or weakness of the face, arm or leg -trouble walking, dizziness, loss of balance or coordination -unusual vaginal bleeding, discharge -unusually weak or tired -yellowing of the eyes or skin Side effects that usually do not require medical attention (Report these to your doctor or health care professional if they continue or are bothersome.): -acne -breast pain -changes in  weight -cough -fever or chills -headache -irregular menstrual bleeding -itching, burning, and   vaginal discharge -pain or difficulty passing urine -sore throat This list may not describe all possible side effects. Call your doctor for medical advice about side effects. You may report side effects to FDA at 1-800-FDA-1088. Where should I keep my medicine? This drug is given in a hospital or clinic and will not be stored at home. NOTE: This sheet is a summary. It may not cover all possible information. If you have questions about this medicine, talk to your doctor, pharmacist, or health care provider.  2015, [employer]. (2011-09-09 15:37:45)  

## 2014-08-18 NOTE — Progress Notes (Signed)
Patient ID: Katherine Jones, female   DOB: 11/08/1980, 34 y.o.   MRN: [id number] Subjective:     Katherine Jones is a 34 y.o. female who presents for a postpartum visit. She is 6 weeks postpartum following a spontaneous vaginal delivery. I have fully reviewed the prenatal and intrapartum course. The delivery was at 37 gestational weeks. Outcome: spontaneous vaginal delivery. Anesthesia: none. Postpartum course has been uneventful. Baby's course has been uneventful. Baby is feeding by breast. Bleeding no bleeding. Bowel function is normal. Bladder function is normal. Patient is not sexually active. Contraception method is Nexplanon. Postpartum depression screening: negative. [place] interpreter ID [id number] The following portions of the patient's history were reviewed and updated as appropriate: allergies, current medications, past family history, past medical history, past social history, past surgical history and problem list.  Review of Systems Pertinent items are noted in HPI.   Objective:    BP 107/78 mmHg  Pulse 78  Temp(Src) 97.6 F (36.4 C)  Wt 99 lb 9.6 oz (45.178 kg)  Breastfeeding? Yes  General:  alert, cooperative and no distress   Breasts:  inspection negative, no nipple discharge or bleeding, no masses or nodularity palpable  Lungs: clear to auscultation bilaterally  Heart:  regular rate and rhythm, S1, S2 normal, no murmur, click, rub or gallop  Abdomen: soft, non-tender; bowel sounds normal; no masses,  no organomegaly   Vulva:  not evaluated  Vagina: not evaluated  Cervix:  not evaluated  Corpus: not examined  Adnexa:  not evaluated  Rectal Exam: Not performed.        Nexplanon inserted per request. Interpretor used. Patient states has no questions and does want it inserted  Patient given informed consent, she signed consent form. Pregnancy test was negative.  Appropriate time out taken.  Patient's left arm was prepped and draped in the usual sterile fashion.. The ruler used to measure and  mark insertion area.  Patient was prepped with alcohol swab and then injected with 5 ml of 1 % lidocaine.  She was prepped with betadine, Nexplanon removed from packaging,  Device confirmed in needle, then inserted full length of needle and withdrawn per handbook instructions.  There was minimal blood loss.  Patient insertion site covered with guaze and a pressure bandage to reduce any bruising.  The patient tolerated the procedure well and was given post procedure instructions. Return in about one month for Nexplanon check.  Assessment:     Normal postpartum exam. Pap smear not done at today's visit.   Plan:    1. Contraception: Nexplanon 2. Confirmed patient has no questions. Return if she has problems. 3. Follow up in: 1 year or as needed.

## 2015-05-03 ENCOUNTER — Ambulatory Visit (INDEPENDENT_AMBULATORY_CARE_PROVIDER_SITE_OTHER): Payer: Self-pay | Admitting: Internal Medicine

## 2015-05-03 ENCOUNTER — Encounter: Payer: Self-pay | Admitting: Internal Medicine

## 2015-05-03 VITALS — BP 116/72 | HR 80 | Resp 16 | Ht <= 58 in | Wt 102.0 lb

## 2015-05-03 DIAGNOSIS — O24410 Gestational diabetes mellitus in pregnancy, diet controlled: Secondary | ICD-10-CM

## 2015-05-03 DIAGNOSIS — L299 Pruritus, unspecified: Secondary | ICD-10-CM

## 2015-05-03 DIAGNOSIS — H7293 Unspecified perforation of tympanic membrane, bilateral: Secondary | ICD-10-CM

## 2015-05-03 MED ORDER — DESLORATADINE 5 MG PO TABS: ORAL_TABLET | ORAL | Status: DC

## 2015-05-03 MED ORDER — PRENATAL VITAMINS PLUS 27-1 MG PO TABS: 1.0000 | ORAL_TABLET | Freq: Every day | ORAL | Status: AC

## 2015-05-03 MED ORDER — DESLORATADINE 5 MG PO TABS: ORAL_TABLET | ORAL | Status: AC

## 2015-05-03 MED ORDER — PRENATAL VITAMINS PLUS 27-1 MG PO TABS: 1.0000 | ORAL_TABLET | Freq: Every day | ORAL | Status: DC

## 2015-05-03 NOTE — Progress Notes (Signed)
   Subjective:    Patient ID: Katherine Jones, female    DOB: 21-Feb-1981, 35 y.o.   MRN: [id number]  HPI  Here with [place] CHW  1.  Dizziness:  Developed this when pregnant with her 72 month old baby.  Appears to have been diagnosed with Gestational diabetes.  Does not appear to have an A1C since 01/2014 in her chart.   Describes spinning sensation when moves suddenly.  Can last up to an hour.  Generally, occurs once every 2 weeks. Cannot say if she notes turning her head a specific way to set off.  Better if lies down and is still.    2.  Pruritis of back:  Noted for first time January 10th.  Notes this mainly when goes to bed.  Feels like she is burning all over.  Sleeps in same as 3 children and her husband.  Her 9 yo child also has itching in bed, but over whole body.  Has never noticed any rash on herself or her 35 yo.   No itchy, watery eyes, nose, throat or ears.  No sneezing or runny nose.   Has not tried to take a medicine to calm the itch Patient is breast feeding her baby. Lives [place] at [address] with her husband and 3 daughters.   Does note a peeling wall in her home.  No definite mold or mildew problem.  No definite problem with water. No roaches or bedbugs noted.        Review of Systems     Objective:   Physical Exam NAD HEENT:  PERRL, EOMI, TMs with quite large perforations bilaterally.  Not clear if ossicles still apparent.  Throat without injection.  Poor dental hygiene Neck:  Supple, no adenopathy Chest:  CTA CV:  RRR without murmur or rub, radial pulses normal and equal Abd:  S, NT, No HSM or masses appreciated, +BS Skin:  Scattered tiny pustules on back with some mildly erythematous papules.       Assessment & Plan:  1.  Bilateral TM perforations Pt. To get orange card and will refer to ENT.  Dizziness likely related to ears  2.  Hx of gestational DM:  With dizziness, check A1C  3.  Itchy back:  Almost looks like prickly heat.  Very mild. Recommend  using Dove soap and hydrating lotion to back.  Avoid getting to hot in bed.  Referral to [facility] for healthy home evaluation with peeling wall and to rule out insects infestation, though sounds unlikely

## 2015-05-03 NOTE — Patient Instructions (Signed)
Please sign up for orange card tomorrow between 9 am and 1 p.m at [facility] next door

## 2015-05-04 LAB — HGB A1C W/O EAG: Hgb A1c MFr Bld: 5.5 % (ref 4.8–5.6)

## 2015-05-10 ENCOUNTER — Encounter: Payer: Self-pay | Admitting: *Deleted

## 2015-05-17 ENCOUNTER — Encounter: Payer: Self-pay | Admitting: Internal Medicine

## 2015-05-17 ENCOUNTER — Ambulatory Visit (INDEPENDENT_AMBULATORY_CARE_PROVIDER_SITE_OTHER): Payer: Self-pay | Admitting: Internal Medicine

## 2015-05-17 VITALS — BP 102/66 | HR 76 | Wt 101.0 lb

## 2015-05-17 DIAGNOSIS — L858 Other specified epidermal thickening: Secondary | ICD-10-CM

## 2015-05-17 DIAGNOSIS — Z23 Encounter for immunization: Secondary | ICD-10-CM

## 2015-05-17 DIAGNOSIS — Q829 Congenital malformation of skin, unspecified: Secondary | ICD-10-CM

## 2015-05-17 NOTE — Progress Notes (Signed)
   Subjective:    Patient ID: Katherine Jones, female    DOB: 11/29/1980, 35 y.o.   MRN: [id number]  HPI   1.  Itchy Back:  Went to [facility] with her husband, who also does not speak [place].  Thought they would not give her the Rx for Clarinex for the itching as she was breastfeeding.  Discussed the orange card cannot be used at retail pharmacy.  Rx was sent to [facility] and she will have to apply through the MAP program.    Not using hydrating cream such as Eucerin Cream.  Not sure what soap she is using.  Ree Mo and Zaryiah Fede have not heard anything from [facility] and [employer] inspection.  Meds:   Nexplanon implant Prenatal vitamins  Allergies:  NKDA    Review of Systems     Objective:   Physical Exam NAD Upper back:  Fewer tiny pustular looking lesions today, however, note that these lesions are not soft pustules, but hard.       Assessment & Plan:  ?Keratosis pilaris:  To use Dove soap, lukewarm water to bathe, Eucerin cream with eczema relief twice daily, especially after bathing.   Discussed Clarinex is at [facility] pharmacy, not retail pharmacy and will need to fill out forms and schedule a meeting with the pharmacist to apply  Also:  For her ENT appt., pt. Is vacillating whether she should wait until she has Medicaid.  Encouraged her to proceed and if she gets Medicaid, can switch to that.

## 2015-05-17 NOTE — Patient Instructions (Signed)
Dove soap for bathing. Eucerin cream with eczema relief all over twice daily especially after bathing.

## 2015-08-17 DIAGNOSIS — H7290 Unspecified perforation of tympanic membrane, unspecified ear: Secondary | ICD-10-CM | POA: Insufficient documentation

## 2015-11-15 ENCOUNTER — Telehealth: Payer: Self-pay | Admitting: Internal Medicine

## 2015-11-15 ENCOUNTER — Encounter: Payer: Self-pay | Admitting: Internal Medicine

## 2015-11-15 ENCOUNTER — Ambulatory Visit (INDEPENDENT_AMBULATORY_CARE_PROVIDER_SITE_OTHER): Payer: Self-pay | Admitting: Internal Medicine

## 2015-11-15 VITALS — BP 112/66 | HR 70 | Temp 98.3°F | Resp 16 | Ht <= 58 in | Wt 99.0 lb

## 2015-11-15 DIAGNOSIS — H7293 Unspecified perforation of tympanic membrane, bilateral: Secondary | ICD-10-CM

## 2015-11-15 DIAGNOSIS — H66002 Acute suppurative otitis media without spontaneous rupture of ear drum, left ear: Secondary | ICD-10-CM

## 2015-11-15 MED ORDER — AMOXICILLIN 500 MG PO CAPS: 500.0000 mg | ORAL_CAPSULE | Freq: Three times a day (TID) | ORAL | 0 refills | Status: AC

## 2015-11-15 NOTE — Telephone Encounter (Signed)
Nurse Loretha BrasilJayne Lutz called to report that patient is having ear pain, fluid from ear and dizzy.  Dennie Bibleat stated that she would [facility]rae AlfredMulberry.  Dennie Bibleat also stated that she would note on appointment book if someone cancelled we would call Ree Moo at [phone] to let her know.   Nurse Junie PanningJayne would like patient seen today if at all possible.

## 2015-11-15 NOTE — Progress Notes (Signed)
   Subjective:    Patient ID: Katherine Jones, female    DOB: 02/23/1981, 35 y.o.   MRN: [id number]  HPI   Left ear pain in past 2 weeks.  Started draining about 1 week ago and pain improved.  Does still have pain in neck just below ear.   Sense of dizziness like her balance is off. No water in ear prior to the pain. No fever.   Has had ear infections before.  Has bilateral perforated TMs [facility]  Was seen reportedly, and CT scan was obtained, but they have not heard anything back.  CT likely done at [facility]  Current Meds  Medication Sig  . etonogestrel (NEXPLANON) 68 MG IMPL implant 1 each by Subdermal route once.  . Naproxen Sodium (ALEVE) 220 MG CAPS Take 220 mg by mouth daily as needed.   No Known Allergies  Review of Systems     Objective:   Physical Exam  NAD HEENT:  PERRL, EOMI, right TM and canal dry but perforation of TM, L TM with purulent discharge and erythematous, thickened TM with chronic perforation as well.  Throat without injection Neck:  Supple, no adenopathy Chest:  CTA CV:  RRR without murmur or rub Abd;  S, NT, No HSM or mass, + BS          Assessment & Plan:  1.  Chronic perforation of bilateral TMs with acute left OM and purulent discharge. Amoxicillin 500 mg three times daily for 10 day course, follow up in 2 weeks.  To call if no improvement in about 4 days or if worsens. Checking on CT scan ordered by ENT.  Unable to find ENT report as well and sending for that.

## 2015-11-15 NOTE — Telephone Encounter (Signed)
Patient has an appointment scheduled for tomorrow with Dr. Delrae AlfredMulberry.

## 2015-11-16 ENCOUNTER — Ambulatory Visit: Payer: Self-pay | Admitting: Internal Medicine

## 2015-12-01 LAB — GLUCOSE, POCT (MANUAL RESULT ENTRY): POC GLUCOSE: 110 mg/dL — AB (ref 70–99)

## 2015-12-06 ENCOUNTER — Ambulatory Visit (INDEPENDENT_AMBULATORY_CARE_PROVIDER_SITE_OTHER): Payer: Self-pay | Admitting: Internal Medicine

## 2015-12-06 ENCOUNTER — Encounter: Payer: Self-pay | Admitting: Internal Medicine

## 2015-12-06 VITALS — BP 102/58 | HR 80 | Resp 18 | Ht <= 58 in | Wt 99.0 lb

## 2015-12-06 DIAGNOSIS — H6692 Otitis media, unspecified, left ear: Secondary | ICD-10-CM

## 2015-12-06 DIAGNOSIS — H7293 Unspecified perforation of tympanic membrane, bilateral: Secondary | ICD-10-CM

## 2015-12-06 NOTE — Patient Instructions (Signed)
I will call Ree Mo and get back with you after I speak to Dr. Lazarus SalinesWolicki

## 2015-12-06 NOTE — Progress Notes (Signed)
   Subjective:    Patient ID: Ricky AlaShu Warnick, female    DOB: 04/11/1980, 35 y.o.   MRN: [id number]  HPI   Here for follow up of left ear infection.  Is finished with Amoxicillin 10 day course.  Cannot recall what day she finished.  No discharge.   Discussed ENT note from Dr. Lazarus SalinesWolicki found in [facility].  He needed Cheri KearnsShu Drakeford to come in with an interpreter to explain the possible surgeries and just appears that never came to pass.  Last documentation on this is 08/15/2015.   Current Meds  Medication Sig  . etonogestrel (NEXPLANON) 68 MG IMPL implant 1 each by Subdermal route once.   No Known Allergies      Review of Systems     Objective:   Physical Exam  NAD HEENT:  TMs with anterior perforation bilaterally, Both TMs and canals dry.          Assessment & Plan:  1.  Acute LOM, resolved.    2.  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  Decreased hearing bilaterally.   Call into Dr. Raye SorrowWolicki's office to see about getting patient back, though will send another referral.  Would like to see if Ree Mo could interpret for her as well.

## 2015-12-27 ENCOUNTER — Ambulatory Visit (INDEPENDENT_AMBULATORY_CARE_PROVIDER_SITE_OTHER): Payer: Medicaid Other | Admitting: Internal Medicine

## 2015-12-27 DIAGNOSIS — Z23 Encounter for immunization: Secondary | ICD-10-CM

## 2015-12-27 NOTE — Progress Notes (Signed)
Here with daughters and they all need influenza vaccine.

## 2016-02-28 ENCOUNTER — Ambulatory Visit (INDEPENDENT_AMBULATORY_CARE_PROVIDER_SITE_OTHER): Payer: Self-pay | Admitting: Licensed Clinical Social Worker

## 2016-02-28 DIAGNOSIS — F439 Reaction to severe stress, unspecified: Secondary | ICD-10-CM

## 2016-02-29 NOTE — Progress Notes (Signed)
Patient ID: Katherine Jones, female   DOB: [birth year], 35 y.o.   MRN: [id number]  LCSW met with Gennette Pac in her home, accompanied by Corky Mull, RN, and Jacquelin Hawking, interpreter. Shirlean Mylar introduced LCSW to [employer] and explained that LCSW will be following up on Legal Aid case. LCSW asked for clarification regarding the Legal Aid referral. Malynda Vonseggern requested help understanding if she can have her mother move in with her; LCSW reviewed her lease and explained the expectations. LCSW and Nayvie Burtt made plans to visit the leasing office together.

## 2016-04-10 ENCOUNTER — Other Ambulatory Visit (INDEPENDENT_AMBULATORY_CARE_PROVIDER_SITE_OTHER): Payer: Self-pay | Admitting: Licensed Clinical Social Worker

## 2016-04-10 DIAGNOSIS — F439 Reaction to severe stress, unspecified: Secondary | ICD-10-CM

## 2016-04-11 NOTE — Progress Notes (Signed)
Patient ID: Katherine Jones, female   DOB: [birth year], 36 y.o.   MRN: [id number]  LCSW and interpreter Katherine Jones met with Katherine Jones at her home to discuss her concerns. LCSW provided information received from Legal Aid regarding the housing concerns with Katherine Jones. LCSW explained that Legal Aid recommended that they send a certified letter with the key to the complex management. LCSW and Katherine Jones reviewed a draft of the letter. LCSW explained how to get a padded envelope and how to do certified mail. Katherine Jones agreed to accompany Katherine Jones to the post office.  Katherine Jones reported that she did not have any other needs at this time.

## 2017-02-26 IMAGING — US US OB FOLLOW-UP
1 series · 12 of 28 positions shown · non-contrast
Comparison: none

[Series 1: us ob follow-up · 0.23mm/px · 67 acquisitions, 12 frames shown]
[im 3/67]
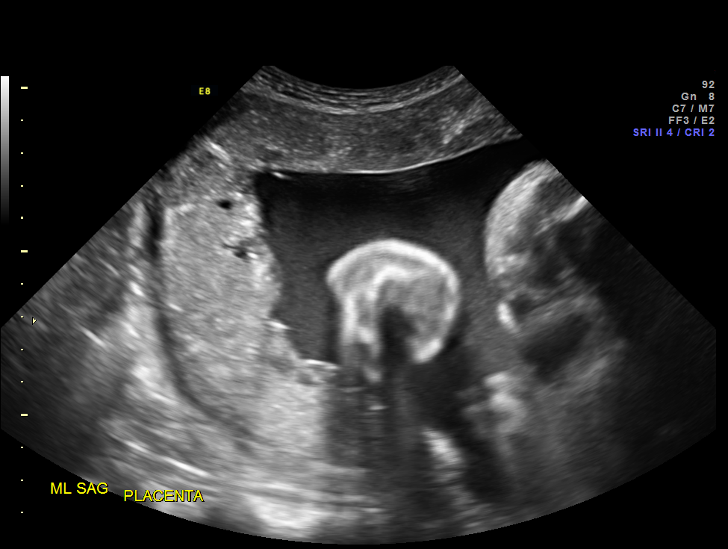
[im 8/67]
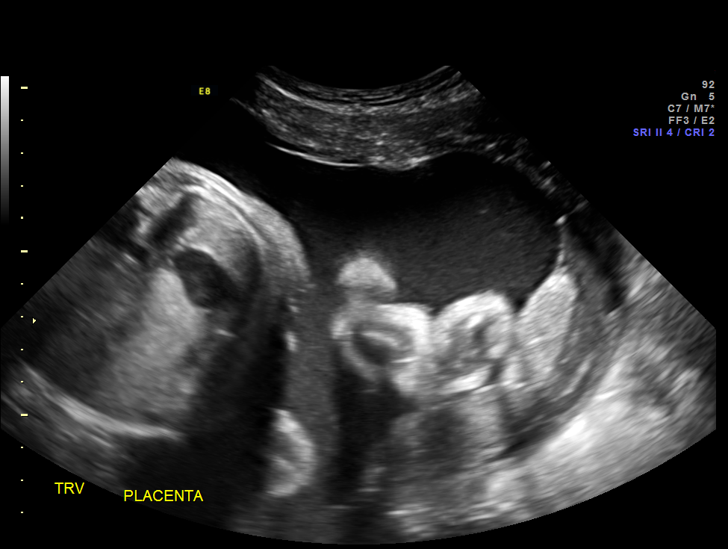
[im 13/67]
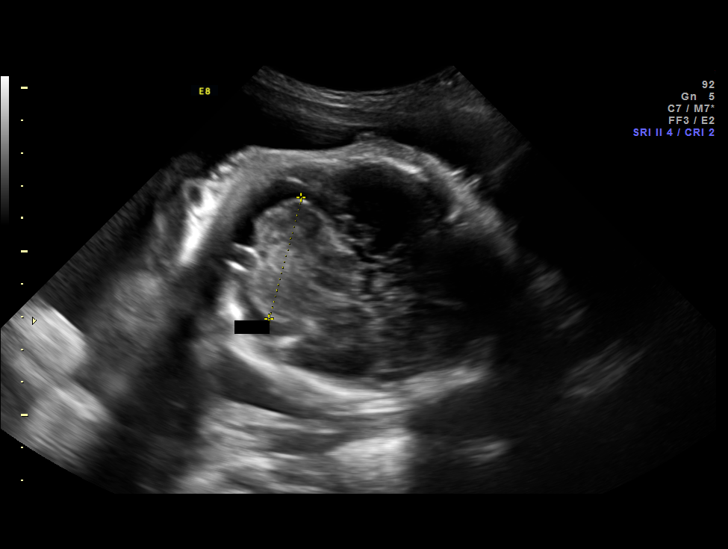
[im 20/67]
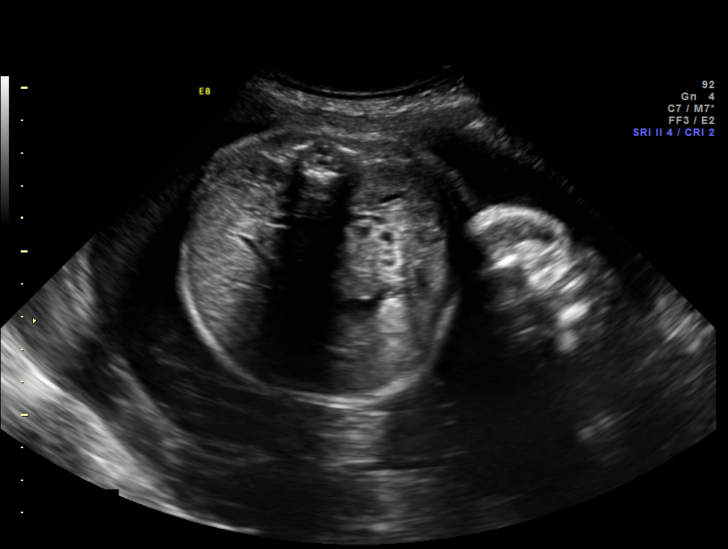
[im 25/67]
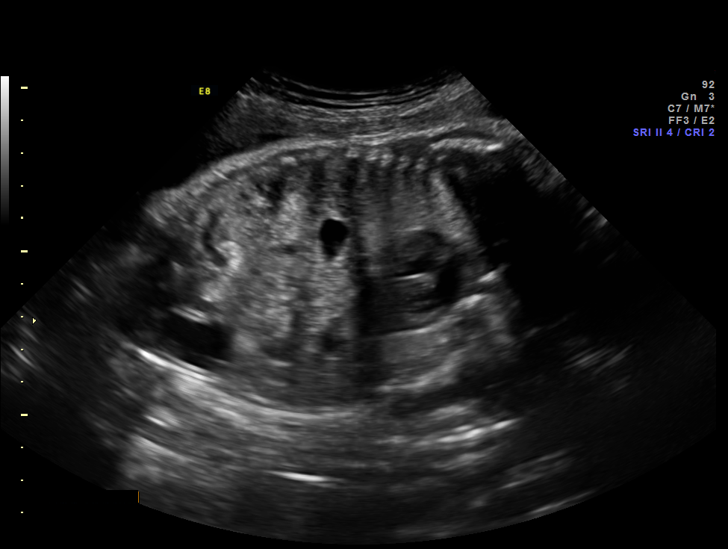
[im 30/67]
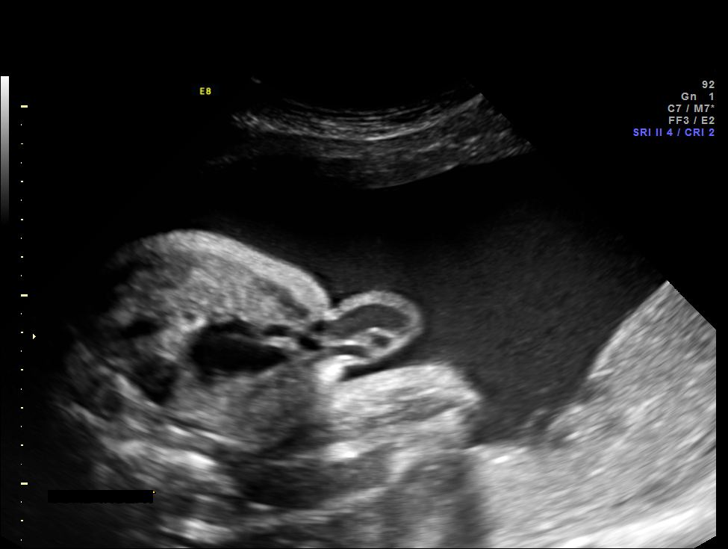
[im 37/67]
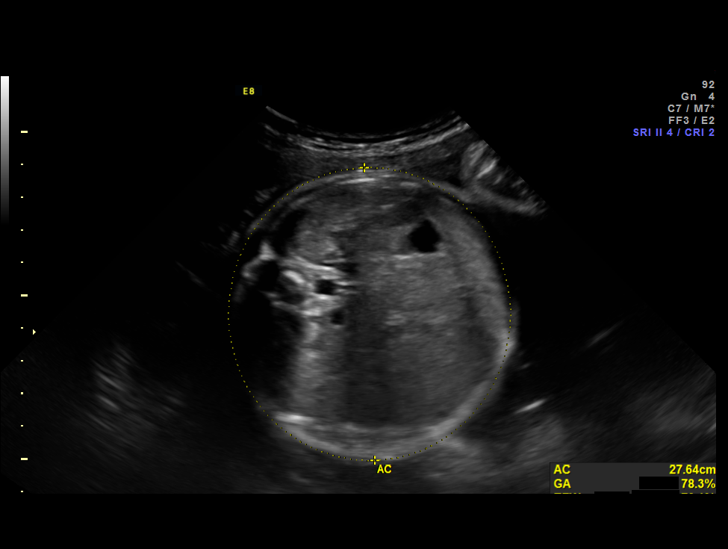
[im 42/67]
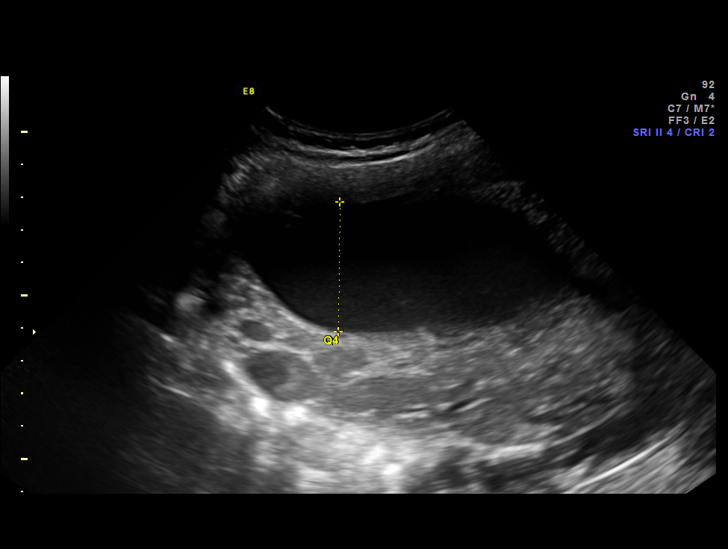
[im 47/67]
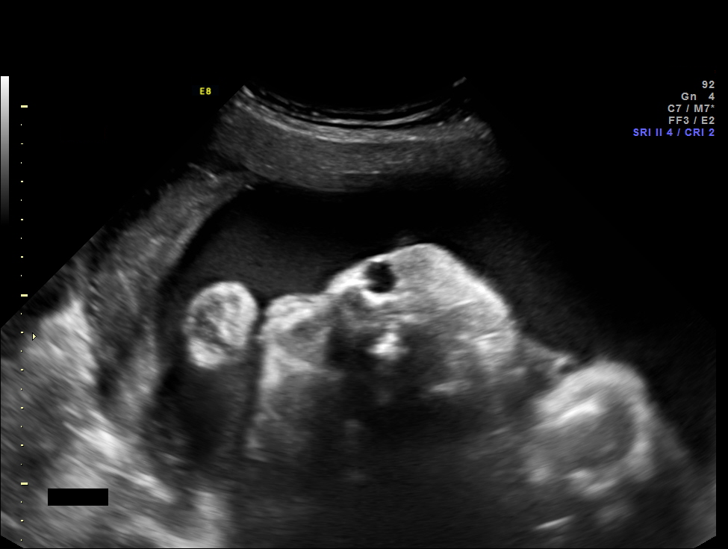
[im 54/67]
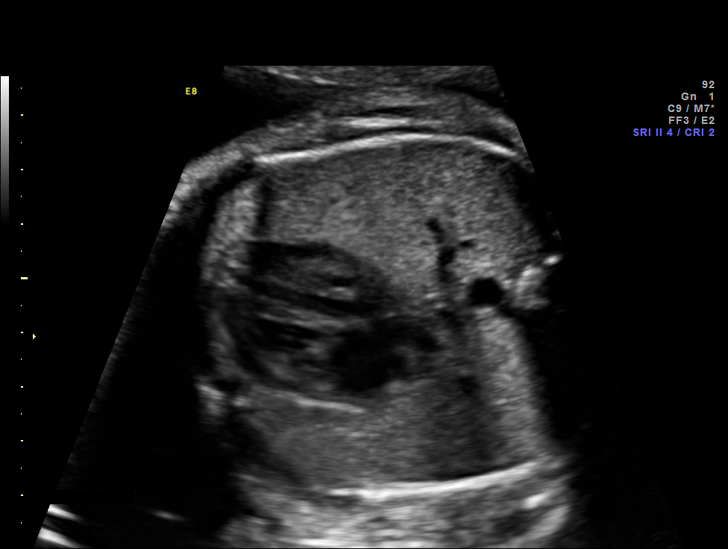
[im 59/67]
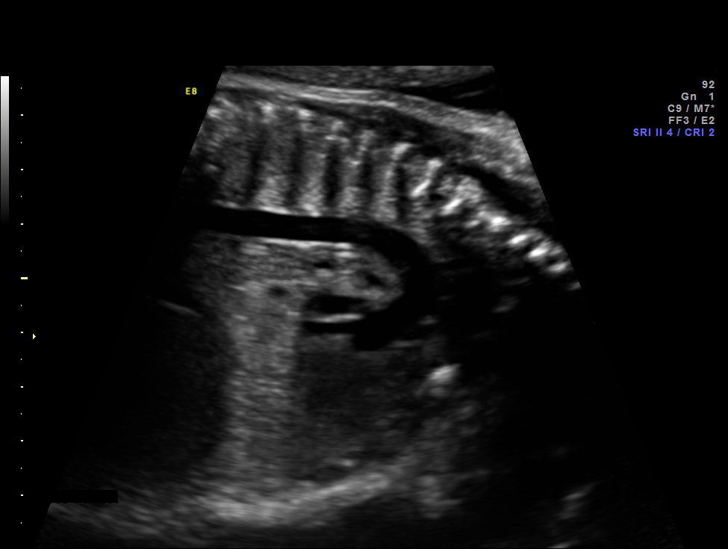
[im 64/67]
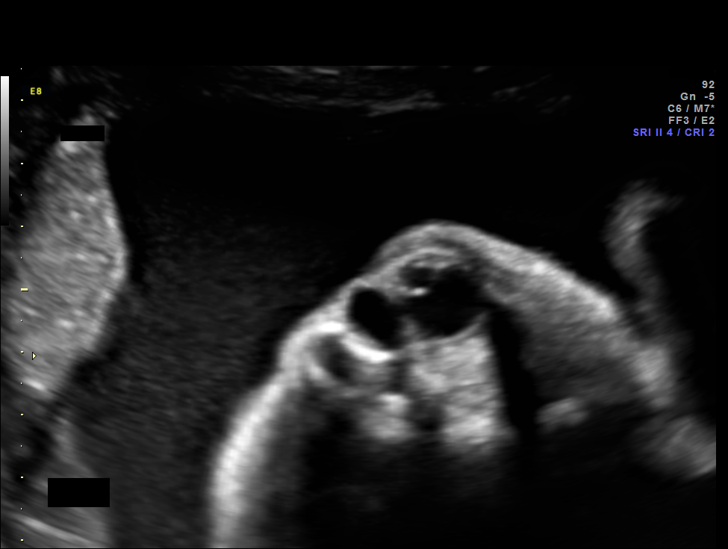

[12 of 28 positions shown; findings below may reference images not displayed]

OBSTETRICS REPORT
                      (Signed Final 05/17/2014 [DATE])

                                                         Faculty Physician
Service(s) Provided

 US OB FOLLOW UP                                       76816.1
Indications

 Gestational diabetes in pregnancy, unspecified
 control; glyburide
 30 weeks gestation of pregnancy
Fetal Evaluation

 Num Of Fetuses:    1
 Fetal Heart Rate:  148                          bpm
 Cardiac Activity:  Observed
 Presentation:      Cephalic
 Placenta:          Left lateral post, above
                    cervical os

 Amniotic Fluid
 AFI FV:      Subjectively upper-normal
 AFI Sum:     20.38   cm       79  %Tile     Larg Pckt:    6.14  cm
 RUQ:   5.91    cm   RLQ:    3.97   cm    LUQ:   4.36    cm   LLQ:    6.14   cm
Biometry

 BPD:     78.4  mm     G. Age:  31w 3d                CI:         76.5   70 - 86
 OFD:    102.5  mm                                    FL/HC:      21.5   19.3 -

 HC:     286.7  mm     G. Age:  31w 3d       41  %    HC/AC:      1.04   0.96 -

 AC:     274.4  mm     G. Age:  31w 4d       74  %    FL/BPD:     78.4   71 - 87
 FL:      61.5  mm     G. Age:  31w 6d       73  %    FL/AC:      22.4   20 - 24
 HUM:     54.4  mm     G. Age:  31w 4d       73  %
 CER:     38.4  mm     G. Age:  32w 5d       82  %

 Est. FW:    2464  gm           4 lb     75  %
Gestational Age

 LMP:           30w 4d        Date:  10/15/13                 EDD:   07/22/14
 U/S Today:     31w 4d                                        EDD:   07/15/14
 Best:          30w 4d     Det. By:  LMP  (10/15/13)          EDD:   07/22/14
Anatomy
 Cranium:          Appears normal         Aortic Arch:      Appears normal
 Fetal Cavum:      Appears normal         Ductal Arch:      Previously seen
 Ventricles:       Appears normal         Diaphragm:        Appears normal
 Choroid Plexus:   Previously seen        Stomach:          Appears normal, left
                                                            sided
 Cerebellum:       Appears normal         Abdomen:          Appears normal
 Posterior Fossa:  Appears normal         Abdominal Wall:   Appears nml (cord
                                                            insert, abd wall)
 Nuchal Fold:      Previously seen        Cord Vessels:     Appears normal (3
                                                            vessel cord)
 Face:             Dacrocystocele on      Kidneys:          Appear normal
                   right
 Lips:             Appears normal         Bladder:          Appears normal
 Heart:            Appears normal         Spine:            Previously seen
                   (4CH, axis, and
                   situs)
 RVOT:             Previously seen        Lower             Previously seen
                                          Extremities:
 LVOT:             Appears normal         Upper             Previously seen
                                          Extremities:

 Other:  Nasal bone prev. visualized. Heels and 5th digit prev. visualized.
         Female gender.
Cervix Uterus Adnexa

 Cervical Length:    4        cm

 Cervix:       Normal appearance by transabdominal scan.

 Left Ovary:    Previously seen.
 Right Ovary:   Previously seen
Impression

 SIUP at 30+4 weeks
 New finding: dacrocystocele (lacrimal duct cyst) on right
 All other interval fetal anatomy was seen and appeared
 normal
 Normal amniotic fluid volume
 Appropriate interval growth with EFW at the 75th %tile

 The US findings were shared with Ms. Matth.  The implications
 of a lacrimal duct cyst were discussed in detail.
Recommendations

 Follow-up ultrasound for growth in 6 weeks
 Postnatal evaluation of newborn

 questions or concerns.

## 2017-03-19 ENCOUNTER — Encounter: Payer: Self-pay | Admitting: Medical

## 2017-04-14 IMAGING — US US FETAL BPP W/O NONSTRESS
1 series · 11 of 11 positions shown · non-contrast
Comparison: none

[Series 2: us fetal bpp w/o nonstress · 11 acquisitions, 11 frames shown]
[im 1/11]
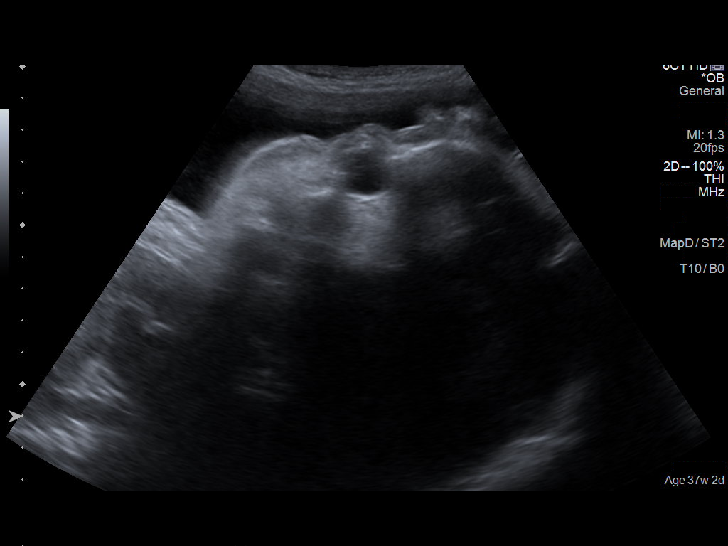
[im 2/11]
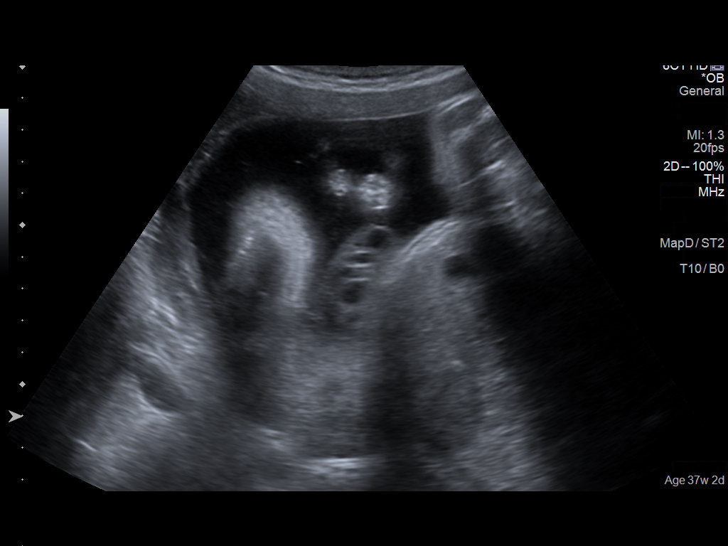
[im 3/11]
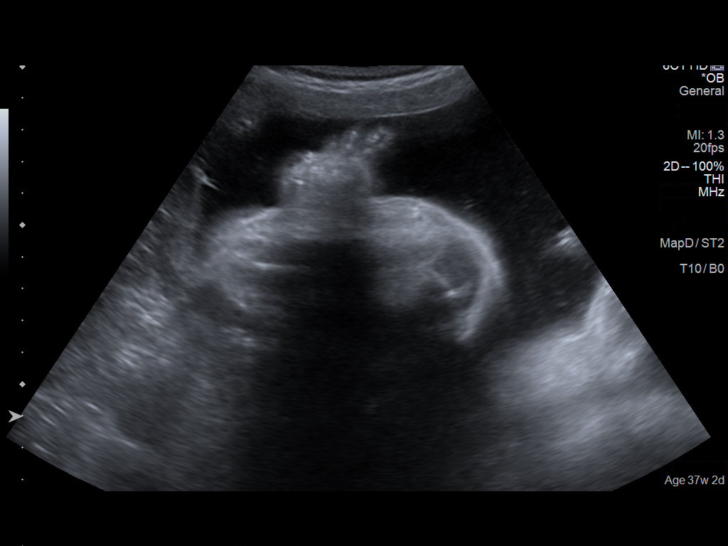
[im 4/11]
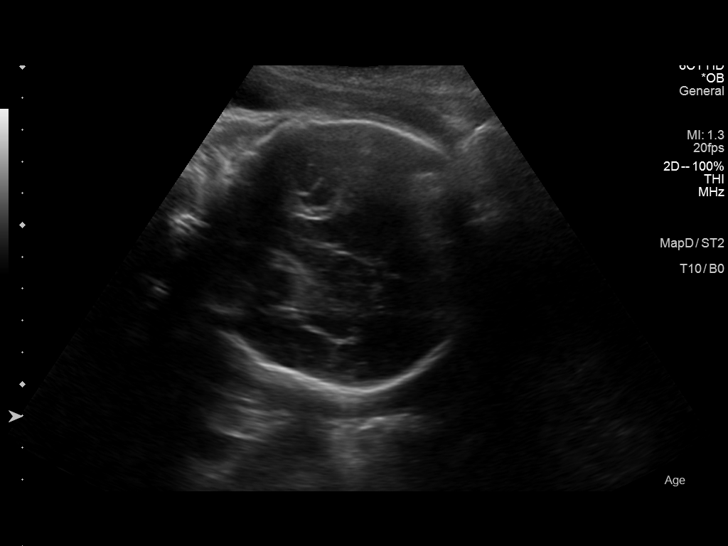
[im 5/11]
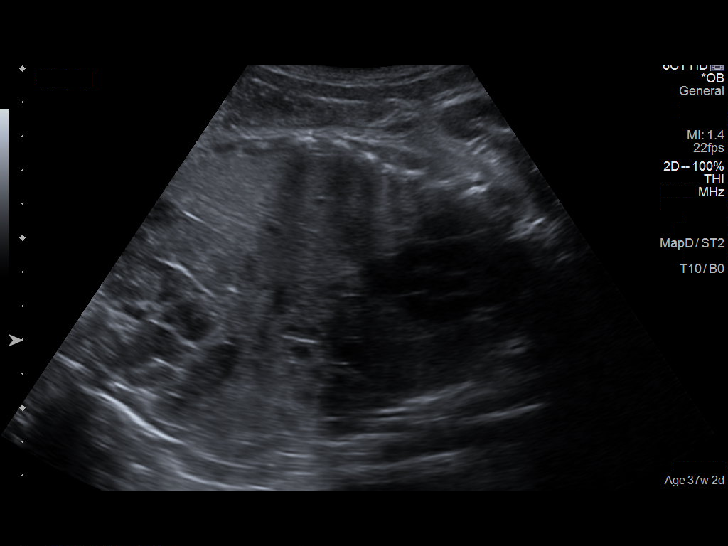
[im 6/11]
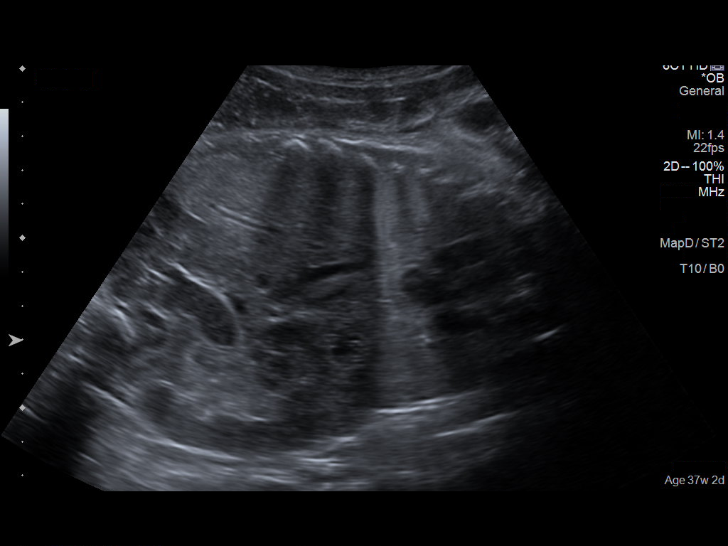
[im 7/11]
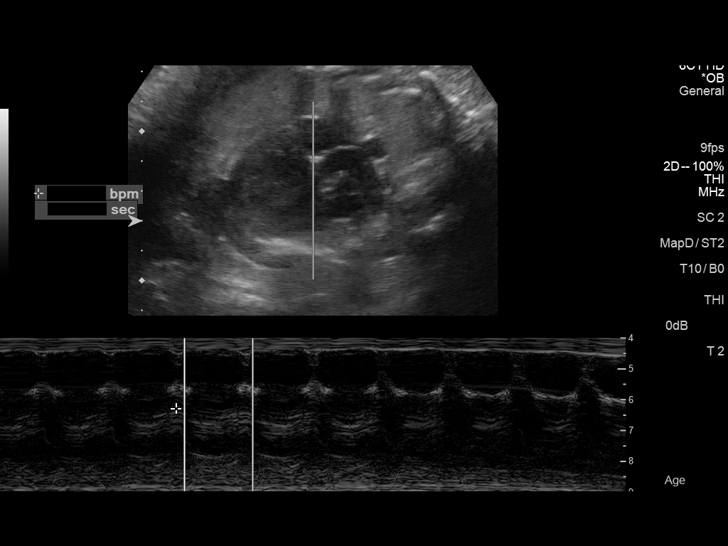
[im 8/11]
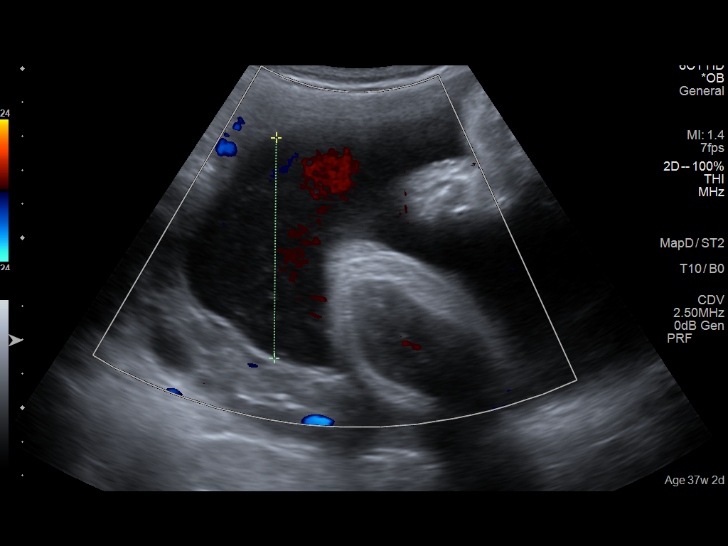
[im 9/11]
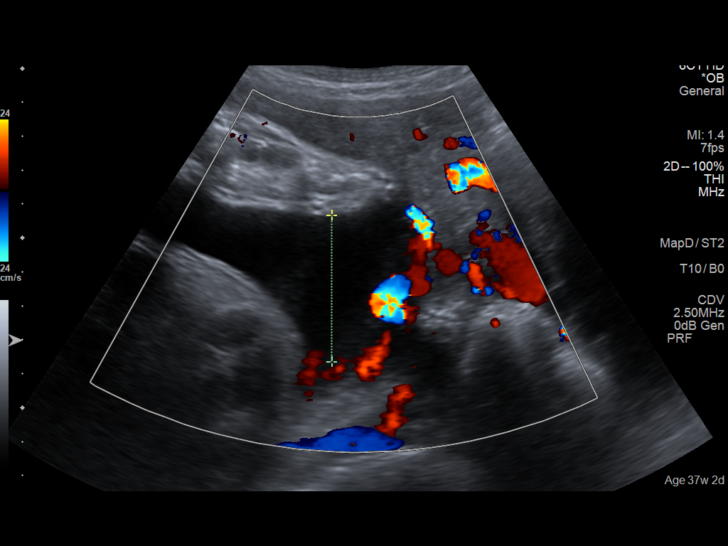
[im 10/11]
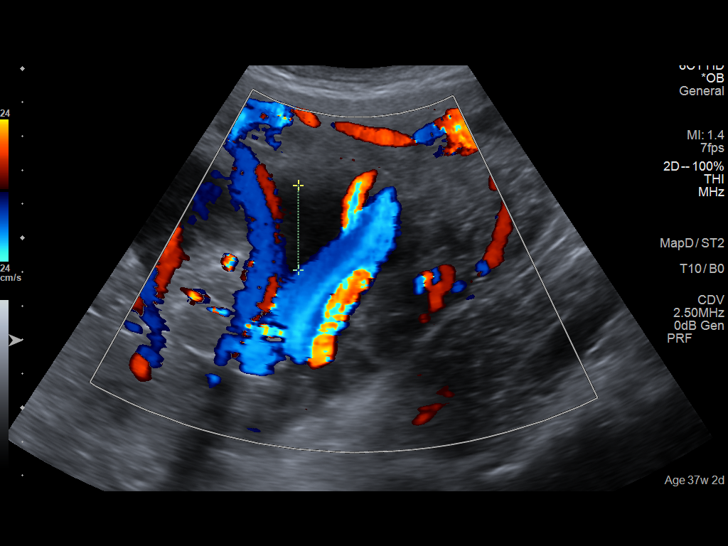
[im 11/11]
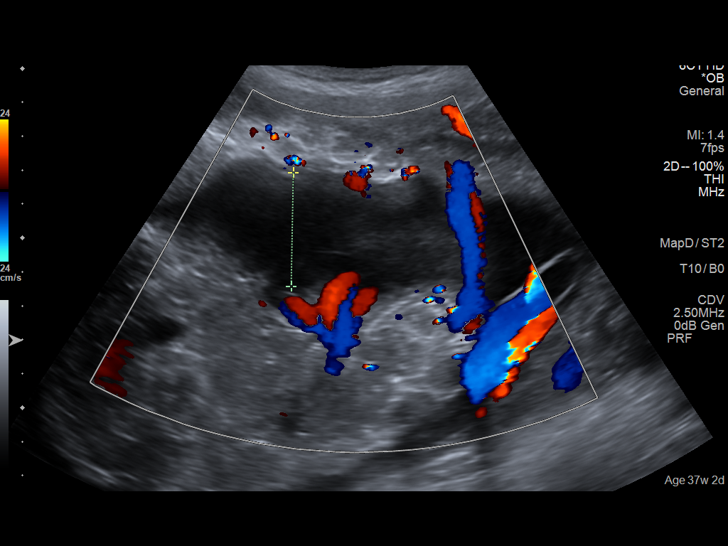

[11 of 11 positions shown; findings below may reference images not displayed]

OBSTETRICS REPORT
(Signed Final 07/04/2014 [DATE])

Service(s) Provided

Indications

Non-reactive NST, FHR decelerations
37 weeks gestation of pregnancy
Fetal Evaluation

Num Of Fetuses:    1
Fetal Heart Rate:  150                          bpm
Cardiac Activity:  Observed
Presentation:      Cephalic

Comment:    BPP [DATE] in 10 mins

Amniotic Fluid
AFI FV:      Subjectively within normal limits
AFI Sum:     16.64   cm       63  %Tile     Larg Pckt:    6.49  cm
RUQ:   6.49    cm   RLQ:    4.31   cm    LUQ:   3.35    cm   LLQ:    2.49   cm
Biophysical Evaluation

Amniotic F.V:   Pocket => 2 cm two         F. Tone:        Observed
planes
F. Movement:    Observed                   Score:          [DATE]
F. Breathing:   Observed
Gestational Age

LMP:           37w 2d        Date:  10/15/13                 EDD:   07/22/14
Best:          37w 2d     Det. By:  LMP  (10/15/13)          EDD:   07/22/14
Impression

Single IUP at 36w 6d
Gestational diabetes on Glyburide
BPP [DATE]
Normal amniotic fluid volume
Recommendations

Continue antenatal testing as scheduled.
questions or concerns.

## 2017-04-30 ENCOUNTER — Encounter: Payer: Self-pay | Admitting: Medical

## 2017-04-30 ENCOUNTER — Other Ambulatory Visit (HOSPITAL_COMMUNITY)
Admission: RE | Admit: 2017-04-30 | Discharge: 2017-04-30 | Disposition: A | Discharge status: Home / Self Care | Payer: Medicaid Other | Source: Ambulatory Visit | Attending: Medical | Admitting: Medical

## 2017-04-30 ENCOUNTER — Ambulatory Visit (INDEPENDENT_AMBULATORY_CARE_PROVIDER_SITE_OTHER): Payer: Medicaid Other | Admitting: Medical

## 2017-04-30 VITALS — BP 117/67 | HR 72 | Ht <= 58 in | Wt 99.4 lb

## 2017-04-30 DIAGNOSIS — Z0000 Encounter for general adult medical examination without abnormal findings: Secondary | ICD-10-CM

## 2017-04-30 DIAGNOSIS — Z3049 Encounter for surveillance of other contraceptives: Secondary | ICD-10-CM

## 2017-04-30 DIAGNOSIS — Z3046 Encounter for surveillance of implantable subdermal contraceptive: Secondary | ICD-10-CM

## 2017-04-30 DIAGNOSIS — Z309 Encounter for contraceptive management, unspecified: Secondary | ICD-10-CM | POA: Diagnosis not present

## 2017-04-30 DIAGNOSIS — Z30017 Encounter for initial prescription of implantable subdermal contraceptive: Secondary | ICD-10-CM

## 2017-04-30 MED ORDER — ETONOGESTREL 68 MG ~~LOC~~ IMPL
68.0000 mg | DRUG_IMPLANT | Freq: Once | SUBCUTANEOUS | Status: AC
  Administered 2017-04-30: 68 mg via SUBCUTANEOUS

## 2017-04-30 NOTE — Progress Notes (Signed)
Patient ID: Katherine Jones, female   DOB: 06/08/1980, 37 y.o.   MRN: [id number]  Subjective:    Katherine Jones is a 37 y.o. female who presents for an annual exam. The patient has no complaints today. Patient would like Nexplanon removed and re-inserted today. Nexplanon was originally placed 08/2014. The patient is sexually active. GYN screening history: last pap: approximate date 2015 and was normal. The patient wears seatbelts: yes. The patient participates in regular exercise: no. Has the patient ever been transfused or tattooed?: not asked. The patient reports that there is not domestic violence in her life.   Menstrual History: OB History    Gravida Para Term Preterm AB Living   3 3 3     3    SAB TAB Ectopic Multiple Live Births         0 3     Patient's last menstrual period was 04/14/2017.    The following portions of the patient's history were reviewed and updated as appropriate: allergies, current medications, past family history, past medical history, past social history, past surgical history and problem list.  Review of Systems Pertinent items are noted in HPI.    Objective:   Physical Exam  Nursing note and vitals reviewed. Constitutional: She is oriented to person, place, and time. She appears well-developed and well-nourished. No distress.  HENT:  Head: Normocephalic.  Cardiovascular: Normal rate.  Respiratory: Effort normal.  GI: Soft. She exhibits no distension and no mass. There is no tenderness. There is no rebound and no guarding.  Genitourinary: Uterus is not enlarged and not tender. Cervix exhibits no motion tenderness, no discharge and no friability. Right adnexum displays no mass and no tenderness. Left adnexum displays no mass and no tenderness. No bleeding in the vagina. No vaginal discharge found.  Neurological: She is alert and oriented to person, place, and time.  Skin: Skin is warm and dry. No erythema.   GYNECOLOGY CLINIC PROCEDURE NOTE  Ms. Katherine Jones is a 37 y.o.  [id number] here for Nexplanon removal and insertion.  Nexplanon Removal and Insertion  Patient was given informed consent for removal of her Nexplanon and insertion of Nexplanon.  Patient does understand that irregular bleeding is a very common side effect of this medication. She was advised to have backup contraception for one week after replacement of the implant.  Appropriate time out taken. Nexplanon site identified. Area prepped in usual sterile fashon. One ml of 1% lidocaine was used to anesthetize the area at the distal end of the implant. A small stab incision was made right beside the implant on the distal portion. The Nexplanon rod was grasped using hemostats and removed without difficulty. There was minimal blood loss. There were no complications. She was re-prepped with betadine, Nexplanon removed from packaging, Device confirmed in needle, then inserted full length of needle and withdrawn per handbook instructions. Nexplanon was able to palpated in the patient's arm; patient palpated the insert herself.  There was minimal blood loss. Patient insertion site covered with guaze and a pressure bandage to reduce any bruising. The patient tolerated the procedure well and was given post procedure instructions.    Assessment:    Healthy female exam.  Nexplanon removal and re-insertion    Plan:    1. Annual exam - Pap smear obtained. Will contact with abnormal results  2. Nexplanon removal and re-insertion - No complications - Nexplanon will need to be removed in 3 years  Kathlene CoteWenzel, Julie N, PA-C 04/30/2017 10:03 AM

## 2017-04-30 NOTE — Addendum Note (Signed)
Addended by: Lorelle GibbsWILSON, CHIQUITA L on: 04/30/2017 12:21 PM   Modules accepted: Orders

## 2017-04-30 NOTE — Addendum Note (Signed)
Addended by: Lorelle GibbsWILSON, CHIQUITA L on: 04/30/2017 12:17 PM   Modules accepted: Orders

## 2017-04-30 NOTE — Patient Instructions (Signed)
Etonogestrel implant What is this medicine? ETONOGESTREL (et oh noe JES trel) is a contraceptive (birth control) device. It is used to prevent pregnancy. It can be used for up to 3 years. This medicine may be used for other purposes; ask your health care provider or pharmacist if you have questions. COMMON BRAND NAME(S): Implanon, Nexplanon What should I tell my health care provider before I take this medicine? They need to know if you have any of these conditions: -abnormal vaginal bleeding -blood vessel disease or blood clots -cancer of the breast, cervix, or liver -depression -diabetes -gallbladder disease -headaches -heart disease or recent heart attack -high blood pressure -high cholesterol -kidney disease -liver disease -renal disease -seizures -tobacco smoker -an unusual or allergic reaction to etonogestrel, other hormones, anesthetics or antiseptics, medicines, foods, dyes, or preservatives -pregnant or trying to get pregnant -breast-feeding How should I use this medicine? This device is inserted just under the skin on the inner side of your upper arm by a health care professional. Talk to your pediatrician regarding the use of this medicine in children. Special care may be needed. Overdosage: If you think you have taken too much of this medicine contact a poison control center or emergency room at once. NOTE: This medicine is only for you. Do not share this medicine with others. What if I miss a dose? This does not apply. What may interact with this medicine? Do not take this medicine with any of the following medications: -amprenavir -bosentan -fosamprenavir This medicine may also interact with the following medications: -barbiturate medicines for inducing sleep or treating seizures -certain medicines for fungal infections like ketoconazole and itraconazole -grapefruit juice -griseofulvin -medicines to treat seizures like carbamazepine, felbamate, oxcarbazepine,  phenytoin, topiramate -modafinil -phenylbutazone -rifampin -rufinamide -some medicines to treat HIV infection like atazanavir, indinavir, lopinavir, nelfinavir, tipranavir, ritonavir -St. John's wort This list may not describe all possible interactions. Give your health care provider a list of all the medicines, herbs, non-prescription drugs, or dietary supplements you use. Also tell them if you smoke, drink alcohol, or use illegal drugs. Some items may interact with your medicine. What should I watch for while using this medicine? This product does not protect you against HIV infection (AIDS) or other sexually transmitted diseases. You should be able to feel the implant by pressing your fingertips over the skin where it was inserted. Contact your doctor if you cannot feel the implant, and use a non-hormonal birth control method (such as condoms) until your doctor confirms that the implant is in place. If you feel that the implant may have broken or become bent while in your arm, contact your healthcare provider. What side effects may I notice from receiving this medicine? Side effects that you should report to your doctor or health care professional as soon as possible: -allergic reactions like skin rash, itching or hives, swelling of the face, lips, or tongue -breast lumps -changes in emotions or moods -depressed mood -heavy or prolonged menstrual bleeding -pain, irritation, swelling, or bruising at the insertion site -scar at site of insertion -signs of infection at the insertion site such as fever, and skin redness, pain or discharge -signs of pregnancy -signs and symptoms of a blood clot such as breathing problems; changes in vision; chest pain; severe, sudden headache; pain, swelling, warmth in the leg; trouble speaking; sudden numbness or weakness of the face, arm or leg -signs and symptoms of liver injury like dark yellow or brown urine; general ill feeling or flu-like symptoms;  light-colored   stools; loss of appetite; nausea; right upper belly pain; unusually weak or tired; yellowing of the eyes or skin -unusual vaginal bleeding, discharge -signs and symptoms of a stroke like changes in vision; confusion; trouble speaking or understanding; severe headaches; sudden numbness or weakness of the face, arm or leg; trouble walking; dizziness; loss of balance or coordination Side effects that usually do not require medical attention (report to your doctor or health care professional if they continue or are bothersome): -acne -back pain -breast pain -changes in weight -dizziness -general ill feeling or flu-like symptoms -headache -irregular menstrual bleeding -nausea -sore throat -vaginal irritation or inflammation This list may not describe all possible side effects. Call your doctor for medical advice about side effects. You may report side effects to FDA at 1-800-FDA-1088. Where should I keep my medicine? This drug is given in a hospital or clinic and will not be stored at home. NOTE: This sheet is a summary. It may not cover all possible information. If you have questions about this medicine, talk to your doctor, pharmacist, or health care provider.  2018 [employer] (2015-09-21 11:19:22) Nexplanon Instructions After Insertion   Keep bandage clean and dry for 24 hours   May use ice/Tylenol/Ibuprofen for soreness or pain   If you develop fever, drainage or increased warmth from incision site-contact office immediately   

## 2017-04-30 NOTE — Progress Notes (Signed)
[employer] interpreter  [phone]

## 2017-05-01 LAB — CYTOLOGY - PAP
DIAGNOSIS: NEGATIVE
HPV (WINDOPATH): NOT DETECTED

## 2017-05-09 ENCOUNTER — Encounter: Payer: Self-pay | Admitting: *Deleted
